# Patient Record
Sex: Male | Born: 1945
Health system: Southern US, Community
[De-identification: ages and names within clinical notes are randomized; demographics above are authoritative.]

## PROBLEM LIST (undated history)

## (undated) DIAGNOSIS — M545 Low back pain, unspecified: Secondary | ICD-10-CM

## (undated) DIAGNOSIS — M25519 Pain in unspecified shoulder: Secondary | ICD-10-CM

## (undated) DIAGNOSIS — M2550 Pain in unspecified joint: Secondary | ICD-10-CM

## (undated) DIAGNOSIS — Z87442 Personal history of urinary calculi: Secondary | ICD-10-CM

## (undated) DIAGNOSIS — E782 Mixed hyperlipidemia: Secondary | ICD-10-CM

## (undated) DIAGNOSIS — K635 Polyp of colon: Secondary | ICD-10-CM

## (undated) DIAGNOSIS — I2510 Atherosclerotic heart disease of native coronary artery without angina pectoris: Secondary | ICD-10-CM

## (undated) DIAGNOSIS — E119 Type 2 diabetes mellitus without complications: Secondary | ICD-10-CM

## (undated) DIAGNOSIS — K219 Gastro-esophageal reflux disease without esophagitis: Secondary | ICD-10-CM

## (undated) DIAGNOSIS — Z87891 Personal history of nicotine dependence: Secondary | ICD-10-CM

## (undated) HISTORY — DX: Mixed hyperlipidemia: E78.2

## (undated) HISTORY — DX: Low back pain: M54.5

## (undated) HISTORY — DX: Personal history of nicotine dependence: Z87.891

## (undated) HISTORY — DX: Pain in unspecified shoulder: M25.519

## (undated) HISTORY — DX: Polyp of colon: K63.5

## (undated) HISTORY — DX: Atherosclerotic heart disease of native coronary artery without angina pectoris: I25.10

## (undated) HISTORY — DX: Low back pain, unspecified: M54.50

## (undated) HISTORY — DX: Type 2 diabetes mellitus without complications: E11.9

## (undated) HISTORY — PX: KIDNEY STONE SURGERY: SHX686

## (undated) HISTORY — DX: Pain in unspecified joint: M25.50

## (undated) HISTORY — DX: Gastro-esophageal reflux disease without esophagitis: K21.9

## (undated) HISTORY — PX: BACK SURGERY: SHX140

---

## 1999-07-02 ENCOUNTER — Encounter: Payer: Self-pay | Admitting: Internal Medicine

## 1999-07-02 ENCOUNTER — Encounter: Admission: RE | Admit: 1999-07-02 | Discharge: 1999-07-02 | Payer: Self-pay | Admitting: Internal Medicine

## 2001-02-04 ENCOUNTER — Encounter: Admission: RE | Admit: 2001-02-04 | Discharge: 2001-02-04 | Payer: Self-pay | Admitting: Urology

## 2001-02-04 ENCOUNTER — Encounter: Payer: Self-pay | Admitting: Urology

## 2001-02-08 ENCOUNTER — Encounter: Payer: Self-pay | Admitting: Urology

## 2001-02-14 ENCOUNTER — Encounter: Payer: Self-pay | Admitting: Urology

## 2001-02-14 ENCOUNTER — Ambulatory Visit (HOSPITAL_COMMUNITY): Admission: RE | Admit: 2001-02-14 | Discharge: 2001-02-14 | Payer: Self-pay | Admitting: Urology

## 2001-02-18 ENCOUNTER — Emergency Department (HOSPITAL_COMMUNITY): Admission: EM | Admit: 2001-02-18 | Discharge: 2001-02-18 | Payer: Self-pay

## 2001-02-18 ENCOUNTER — Ambulatory Visit (HOSPITAL_COMMUNITY): Admission: RE | Admit: 2001-02-18 | Discharge: 2001-02-18 | Payer: Self-pay | Admitting: Urology

## 2001-02-25 ENCOUNTER — Encounter: Admission: RE | Admit: 2001-02-25 | Discharge: 2001-02-25 | Payer: Self-pay | Admitting: Urology

## 2001-02-25 ENCOUNTER — Encounter: Payer: Self-pay | Admitting: Urology

## 2001-03-16 ENCOUNTER — Encounter: Payer: Self-pay | Admitting: Urology

## 2001-03-16 ENCOUNTER — Encounter: Admission: RE | Admit: 2001-03-16 | Discharge: 2001-03-16 | Payer: Self-pay | Admitting: Urology

## 2010-08-10 HISTORY — PX: CORONARY ARTERY BYPASS GRAFT: SHX141

## 2010-08-10 HISTORY — PX: CARDIAC CATHETERIZATION: SHX172

## 2010-10-09 ENCOUNTER — Inpatient Hospital Stay (HOSPITAL_BASED_OUTPATIENT_CLINIC_OR_DEPARTMENT_OTHER)
Admission: RE | Admit: 2010-10-09 | Discharge: 2010-10-09 | Disposition: A | Discharge status: Other Acute Inpatient Hospital | Payer: Medicare Other | Source: Ambulatory Visit | Attending: Interventional Cardiology | Admitting: Interventional Cardiology

## 2010-10-09 ENCOUNTER — Inpatient Hospital Stay (HOSPITAL_COMMUNITY)
Admission: AD | Admit: 2010-10-09 | Discharge: 2010-10-17 | DRG: 234 | Disposition: A | Discharge status: Home / Self Care | Payer: Medicare Other | Source: Ambulatory Visit | Attending: Surgery | Admitting: Surgery

## 2010-10-09 DIAGNOSIS — E119 Type 2 diabetes mellitus without complications: Secondary | ICD-10-CM | POA: Diagnosis present

## 2010-10-09 DIAGNOSIS — E785 Hyperlipidemia, unspecified: Secondary | ICD-10-CM | POA: Diagnosis present

## 2010-10-09 DIAGNOSIS — I2510 Atherosclerotic heart disease of native coronary artery without angina pectoris: Secondary | ICD-10-CM | POA: Insufficient documentation

## 2010-10-09 DIAGNOSIS — I2584 Coronary atherosclerosis due to calcified coronary lesion: Secondary | ICD-10-CM | POA: Insufficient documentation

## 2010-10-09 DIAGNOSIS — E8779 Other fluid overload: Secondary | ICD-10-CM | POA: Diagnosis not present

## 2010-10-09 DIAGNOSIS — Z87891 Personal history of nicotine dependence: Secondary | ICD-10-CM

## 2010-10-09 DIAGNOSIS — D62 Acute posthemorrhagic anemia: Secondary | ICD-10-CM | POA: Diagnosis not present

## 2010-10-09 DIAGNOSIS — I209 Angina pectoris, unspecified: Secondary | ICD-10-CM | POA: Insufficient documentation

## 2010-10-09 LAB — GLUCOSE, CAPILLARY: Glucose-Capillary: 154 mg/dL — ABNORMAL HIGH (ref 70–99)

## 2010-10-10 DIAGNOSIS — Z01810 Encounter for preprocedural cardiovascular examination: Secondary | ICD-10-CM

## 2010-10-10 DIAGNOSIS — I2510 Atherosclerotic heart disease of native coronary artery without angina pectoris: Secondary | ICD-10-CM

## 2010-10-10 LAB — URINALYSIS, ROUTINE W REFLEX MICROSCOPIC
Glucose, UA: NEGATIVE mg/dL
Hgb urine dipstick: NEGATIVE
Protein, ur: NEGATIVE mg/dL
Specific Gravity, Urine: 1.016 (ref 1.005–1.030)
pH: 5.5 (ref 5.0–8.0)

## 2010-10-10 LAB — GLUCOSE, CAPILLARY: Glucose-Capillary: 135 mg/dL — ABNORMAL HIGH (ref 70–99)

## 2010-10-10 LAB — BASIC METABOLIC PANEL
BUN: 15 mg/dL (ref 6–23)
CO2: 26 mEq/L (ref 19–32)
Calcium: 9.2 mg/dL (ref 8.4–10.5)
Creatinine, Ser: 0.9 mg/dL (ref 0.4–1.5)
GFR calc Af Amer: >60 mL/min (ref >60)

## 2010-10-10 LAB — CBC
Hemoglobin: 14.3 g/dL (ref 13.0–17.0)
MCH: 30 pg (ref 26.0–34.0)
MCHC: 34 g/dL (ref 30.0–36.0)

## 2010-10-10 LAB — POCT I-STAT GLUCOSE: Glucose, Bld: 111 mg/dL — ABNORMAL HIGH (ref 70–99)

## 2010-10-11 LAB — GLUCOSE, CAPILLARY
Glucose-Capillary: 147 mg/dL — ABNORMAL HIGH (ref 70–99)
Glucose-Capillary: 142 mg/dL — ABNORMAL HIGH (ref 70–99)
Glucose-Capillary: 137 mg/dL — ABNORMAL HIGH (ref 70–99)

## 2010-10-12 ENCOUNTER — Inpatient Hospital Stay (HOSPITAL_COMMUNITY): Payer: Medicare Other

## 2010-10-12 LAB — GLUCOSE, CAPILLARY
Glucose-Capillary: 177 mg/dL — ABNORMAL HIGH (ref 70–99)
Glucose-Capillary: 224 mg/dL — ABNORMAL HIGH (ref 70–99)
Glucose-Capillary: 172 mg/dL — ABNORMAL HIGH (ref 70–99)
Glucose-Capillary: 178 mg/dL — ABNORMAL HIGH (ref 70–99)

## 2010-10-12 LAB — COMPREHENSIVE METABOLIC PANEL
AST: 20 U/L (ref 0–37)
Albumin: 3.7 g/dL (ref 3.5–5.2)
BUN: 16 mg/dL (ref 6–23)
Chloride: 107 mEq/L (ref 96–112)
Creatinine, Ser: 0.9 mg/dL (ref 0.4–1.5)
GFR calc Af Amer: >60 mL/min (ref >60)
Total Bilirubin: 0.7 mg/dL (ref 0.3–1.2)
Total Protein: 6.1 g/dL (ref 6.0–8.3)

## 2010-10-12 LAB — POCT I-STAT 3, ART BLOOD GAS (G3+)
Acid-Base Excess: 1 mmol/L (ref 0.0–2.0)
Acid-Base Excess: 2 mmol/L (ref 0.0–2.0)
Bicarbonate: 25.9 mEq/L — ABNORMAL HIGH (ref 20.0–24.0)
O2 Saturation: 89 %
O2 Saturation: 100 %
Patient temperature: 98.7
TCO2: 27 mmol/L (ref 0–100)
TCO2: 27 mmol/L (ref 0–100)
pCO2 arterial: 35.6 mmHg (ref 35.0–45.0)
pO2, Arterial: 201 mmHg — ABNORMAL HIGH (ref 80.0–100.0)

## 2010-10-12 LAB — CBC
MCH: 30.1 pg (ref 26.0–34.0)
MCHC: 34.3 g/dL (ref 30.0–36.0)
MCV: 87.7 fL (ref 78.0–100.0)
Platelets: 188 10*3/uL (ref 150–400)

## 2010-10-12 LAB — LIPID PANEL
HDL: 29 mg/dL — ABNORMAL LOW (ref >39)
LDL Cholesterol: 72 mg/dL (ref 0–99)
Total CHOL/HDL Ratio: 4 RATIO
Triglycerides: 82 mg/dL (ref <150)
VLDL: 16 mg/dL (ref 0–40)

## 2010-10-12 LAB — BLOOD GAS, ARTERIAL
Acid-base deficit: 0.5 mmol/L (ref 0.0–2.0)
Bicarbonate: 23 mEq/L (ref 20.0–24.0)
TCO2: 24 mmol/L (ref 0–100)
pCO2 arterial: 33.9 mmHg — ABNORMAL LOW (ref 35.0–45.0)
pH, Arterial: 7.446 (ref 7.350–7.450)
pO2, Arterial: 201 mmHg — ABNORMAL HIGH (ref 80.0–100.0)

## 2010-10-12 LAB — HEMOGLOBIN A1C: Hgb A1c MFr Bld: 7.3 % — ABNORMAL HIGH (ref <5.7)

## 2010-10-12 LAB — ABO/RH: ABO/RH(D): A POS

## 2010-10-12 LAB — PROTIME-INR: Prothrombin Time: 12.8 seconds (ref 11.6–15.2)

## 2010-10-13 ENCOUNTER — Inpatient Hospital Stay (HOSPITAL_COMMUNITY): Payer: Medicare Other

## 2010-10-13 DIAGNOSIS — I2510 Atherosclerotic heart disease of native coronary artery without angina pectoris: Secondary | ICD-10-CM

## 2010-10-13 LAB — GLUCOSE, CAPILLARY
Glucose-Capillary: 147 mg/dL — ABNORMAL HIGH (ref 70–99)
Glucose-Capillary: 115 mg/dL — ABNORMAL HIGH (ref 70–99)
Glucose-Capillary: 173 mg/dL — ABNORMAL HIGH (ref 70–99)
Glucose-Capillary: 178 mg/dL — ABNORMAL HIGH (ref 70–99)

## 2010-10-13 LAB — SURGICAL PCR SCREEN
MRSA, PCR: NEGATIVE
Staphylococcus aureus: NEGATIVE

## 2010-10-13 LAB — MAGNESIUM: Magnesium: 2.5 mg/dL (ref 1.5–2.5)

## 2010-10-13 LAB — CBC
HCT: 33 % — ABNORMAL LOW (ref 39.0–52.0)
Hemoglobin: 12.2 g/dL — ABNORMAL LOW (ref 13.0–17.0)
MCH: 30 pg (ref 26.0–34.0)
MCH: 29.6 pg (ref 26.0–34.0)
MCHC: 34.5 g/dL (ref 30.0–36.0)
MCV: 86.8 fL (ref 78.0–100.0)
RBC: 4.12 MIL/uL — ABNORMAL LOW (ref 4.22–5.81)
RDW: 12.8 % (ref 11.5–15.5)
WBC: 9.3 10*3/uL (ref 4.0–10.5)

## 2010-10-13 LAB — POCT I-STAT 3, ART BLOOD GAS (G3+)
Acid-base deficit: 1 mmol/L (ref 0.0–2.0)
Bicarbonate: 20.8 mEq/L (ref 20.0–24.0)
TCO2: 22 mmol/L (ref 0–100)
pO2, Arterial: 146 mmHg — ABNORMAL HIGH (ref 80.0–100.0)

## 2010-10-13 LAB — CREATININE, SERUM
Creatinine, Ser: 0.79 mg/dL (ref 0.4–1.5)
GFR calc Af Amer: >60 mL/min (ref >60)

## 2010-10-13 LAB — POCT I-STAT, CHEM 8
Creatinine, Ser: 0.9 mg/dL (ref 0.4–1.5)
Glucose, Bld: 176 mg/dL — ABNORMAL HIGH (ref 70–99)
Hemoglobin: 12.2 g/dL — ABNORMAL LOW (ref 13.0–17.0)
Potassium: 4.9 mEq/L (ref 3.5–5.1)

## 2010-10-13 LAB — POCT I-STAT 4, (NA,K, GLUC, HGB,HCT)
Potassium: 3.2 mEq/L — ABNORMAL LOW (ref 3.5–5.1)
Sodium: 144 mEq/L (ref 135–145)

## 2010-10-13 LAB — APTT: aPTT: 32 seconds (ref 24–37)

## 2010-10-14 ENCOUNTER — Inpatient Hospital Stay (HOSPITAL_COMMUNITY): Payer: Medicare Other

## 2010-10-14 LAB — CREATININE, SERUM: GFR calc non Af Amer: >60 mL/min (ref >60)

## 2010-10-14 LAB — CBC
HCT: 33.7 % — ABNORMAL LOW (ref 39.0–52.0)
HCT: 32.4 % — ABNORMAL LOW (ref 39.0–52.0)
MCH: 29.6 pg (ref 26.0–34.0)
MCHC: 33.5 g/dL (ref 30.0–36.0)
MCHC: 33.6 g/dL (ref 30.0–36.0)
MCV: 88.2 fL (ref 78.0–100.0)
MCV: 88 fL (ref 78.0–100.0)
Platelets: 150 10*3/uL (ref 150–400)
RDW: 13.2 % (ref 11.5–15.5)
RDW: 13.2 % (ref 11.5–15.5)
WBC: 12.8 10*3/uL — ABNORMAL HIGH (ref 4.0–10.5)

## 2010-10-14 LAB — POCT I-STAT 3, ART BLOOD GAS (G3+)
Acid-base deficit: 2 mmol/L (ref 0.0–2.0)
Bicarbonate: 27.1 mEq/L — ABNORMAL HIGH (ref 20.0–24.0)
Bicarbonate: 21.6 mEq/L (ref 20.0–24.0)
O2 Saturation: 93 %
TCO2: 28 mmol/L (ref 0–100)
pCO2 arterial: 29.5 mmHg — ABNORMAL LOW (ref 35.0–45.0)
pH, Arterial: 7.381 (ref 7.350–7.450)
pO2, Arterial: 327 mmHg — ABNORMAL HIGH (ref 80.0–100.0)
pO2, Arterial: 57 mmHg — ABNORMAL LOW (ref 80.0–100.0)

## 2010-10-14 LAB — POCT I-STAT 4, (NA,K, GLUC, HGB,HCT)
Glucose, Bld: 142 mg/dL — ABNORMAL HIGH (ref 70–99)
Glucose, Bld: 109 mg/dL — ABNORMAL HIGH (ref 70–99)
Glucose, Bld: 146 mg/dL — ABNORMAL HIGH (ref 70–99)
HCT: 37 % — ABNORMAL LOW (ref 39.0–52.0)
HCT: 26 % — ABNORMAL LOW (ref 39.0–52.0)
Hemoglobin: 12.2 g/dL — ABNORMAL LOW (ref 13.0–17.0)
Hemoglobin: 8.8 g/dL — ABNORMAL LOW (ref 13.0–17.0)
Potassium: 4 mEq/L (ref 3.5–5.1)
Potassium: 4 mEq/L (ref 3.5–5.1)
Potassium: 3.7 mEq/L (ref 3.5–5.1)
Sodium: 141 mEq/L (ref 135–145)

## 2010-10-14 LAB — BASIC METABOLIC PANEL
CO2: 21 mEq/L (ref 19–32)
Calcium: 8 mg/dL — ABNORMAL LOW (ref 8.4–10.5)
Chloride: 111 mEq/L (ref 96–112)
Creatinine, Ser: 0.77 mg/dL (ref 0.4–1.5)
Glucose, Bld: 204 mg/dL — ABNORMAL HIGH (ref 70–99)

## 2010-10-14 LAB — GLUCOSE, CAPILLARY
Glucose-Capillary: 160 mg/dL — ABNORMAL HIGH (ref 70–99)
Glucose-Capillary: 184 mg/dL — ABNORMAL HIGH (ref 70–99)

## 2010-10-14 LAB — PREPARE PLATELETS: Unit division: 0

## 2010-10-15 ENCOUNTER — Inpatient Hospital Stay (HOSPITAL_COMMUNITY): Payer: Medicare Other

## 2010-10-15 LAB — GLUCOSE, CAPILLARY
Glucose-Capillary: 124 mg/dL — ABNORMAL HIGH (ref 70–99)
Glucose-Capillary: 145 mg/dL — ABNORMAL HIGH (ref 70–99)
Glucose-Capillary: 188 mg/dL — ABNORMAL HIGH (ref 70–99)

## 2010-10-15 LAB — BASIC METABOLIC PANEL
BUN: 13 mg/dL (ref 6–23)
Creatinine, Ser: 0.82 mg/dL (ref 0.4–1.5)
GFR calc Af Amer: >60 mL/min (ref >60)
GFR calc non Af Amer: >60 mL/min (ref >60)
Potassium: 3.8 mEq/L (ref 3.5–5.1)

## 2010-10-15 LAB — CBC
MCV: 89.1 fL (ref 78.0–100.0)
Platelets: 138 10*3/uL — ABNORMAL LOW (ref 150–400)
RDW: 13.2 % (ref 11.5–15.5)
WBC: 10.8 10*3/uL — ABNORMAL HIGH (ref 4.0–10.5)

## 2010-10-15 LAB — POCT I-STAT, CHEM 8
BUN: 10 mg/dL (ref 6–23)
Calcium, Ion: 1.2 mmol/L (ref 1.12–1.32)
Chloride: 103 mEq/L (ref 96–112)
Glucose, Bld: 157 mg/dL — ABNORMAL HIGH (ref 70–99)

## 2010-10-15 LAB — CROSSMATCH: Unit division: 0

## 2010-10-16 LAB — CBC
HCT: 32.8 % — ABNORMAL LOW (ref 39.0–52.0)
Hemoglobin: 10.9 g/dL — ABNORMAL LOW (ref 13.0–17.0)
MCH: 29.1 pg (ref 26.0–34.0)
MCHC: 33.2 g/dL (ref 30.0–36.0)
MCV: 87.5 fL (ref 78.0–100.0)
Platelets: 150 10*3/uL (ref 150–400)
RBC: 3.75 MIL/uL — ABNORMAL LOW (ref 4.22–5.81)
RDW: 13.1 % (ref 11.5–15.5)
WBC: 10.1 10*3/uL (ref 4.0–10.5)

## 2010-10-16 LAB — GLUCOSE, CAPILLARY
Glucose-Capillary: 117 mg/dL — ABNORMAL HIGH (ref 70–99)
Glucose-Capillary: 107 mg/dL — ABNORMAL HIGH (ref 70–99)
Glucose-Capillary: 150 mg/dL — ABNORMAL HIGH (ref 70–99)

## 2010-10-16 LAB — BASIC METABOLIC PANEL
BUN: 14 mg/dL (ref 6–23)
CO2: 26 mEq/L (ref 19–32)
Calcium: 8.5 mg/dL (ref 8.4–10.5)
Chloride: 104 mEq/L (ref 96–112)
Creatinine, Ser: 0.83 mg/dL (ref 0.4–1.5)
GFR calc Af Amer: >60 mL/min (ref >60)
GFR calc non Af Amer: >60 mL/min (ref >60)
Glucose, Bld: 164 mg/dL — ABNORMAL HIGH (ref 70–99)
Potassium: 3.7 mEq/L (ref 3.5–5.1)
Sodium: 137 mEq/L (ref 135–145)

## 2010-10-16 NOTE — Consult Note (Signed)
Derrick Edwards, Derrick Edwards                 ACCOUNT NO.:  [id number]  MEDICAL RECORD NO.:  [id number]           PATIENT TYPE:  LOCATION:                                 FACILITY:  PHYSICIAN:  Evelene Croon, M.D.     DATE OF BIRTH:  11-02-1945  DATE OF CONSULTATION:  10/09/2010 DATE OF DISCHARGE:                                CONSULTATION   REFERRING PHYSICIAN:  Corky Crafts, MD  REASON FOR CONSULTATION:  Left main and severe multivessel coronary artery disease.  CLINICAL HISTORY:  I was asked by Dr. Eldridge Dace to evaluate Derrick Edwards for consideration of coronary artery bypass graft surgery for the above problem.  He is a 65 year old gentleman with no prior cardiac history, who reports having exertional substernal chest pain as well as shortness of breath and fatigue beginning around Christmas time.  This has been a dull pressure in the chest, has occurred typically with walking up hills or lifting heavy things.  It has always been relieved with stopping. These symptoms did not occur every day.  He had no symptoms at rest.  He ignored the symptoms until he recently went in for a routine physical examination by his primary care provider, Dr. Kevan Ny.  He said that he related the symptoms to Dr. Kevan Ny and was referred immediately for Cardiology evaluation.  A stress nuclear exam showed evidence of inferolateral ischemia.  He underwent cardiac catheterization today in [facility], which showed left main to be heavily calcified with about 60% mid left main stenosis with calcium extending into the ostium of the left circumflex.  Left circumflex was occluded at the ostium with the distal vessel filling by collaterals from the left and right.  The LAD had some ostial calcified plaque up to about 50% stenosis.  The first diagonal had about 50% ostial stenosis and the second diagonal was widely patent.  The right coronary artery was a large dominant vessel with 40% mid vessel lesion that was not  significant.  Ejection fraction was 55%.  End-diastolic pressure was 13.  There was no gradient across the aortic valve and no mitral regurgitation.  Abdominal aortogram showed no evidence of an aneurysm and no renal artery stenosis.  REVIEW OF SYSTEMS:  As follows:  GENERAL:  He denies any fever or chills.  He has had no recent weight changes.  He does report significant fatigue since around Christmas time.  EYES:  Negative.  ENT: Negative.  ENDOCRINE:  He denies hypothyroidism.  He has had diabetes for several years.  CARDIOVASCULAR:  As above.  He denies PND or orthopnea.  He has had no palpitations or peripheral edema. RESPIRATORY:  He denies cough and sputum production.  GI:  He has had no nausea, vomiting.  He denies melena and bright red blood per rectum. Does have gastroesophageal reflux disease.  GU:  He denies dysuria and hematuria.  MUSCULOSKELETAL:  He does have degenerative disk disease in his lower back causing pain.  He has had prior back surgery. NEUROLOGICAL:  He denies any focal weakness or numbness.  He denies dizziness and syncope.  He has never  had TIA or stroke.  ALLERGIES: None.  He has had myalgias and arthralgias with Vytorin.  PSYCHIATRIC: Negative.  HEMATOLOGIC:  Negative.  Medications are as noted on his medicine reconciliation form and were reviewed.  PAST MEDICAL HISTORY:  Significant for diabetes and hyperlipidemia.  He has a history of lower back surgery in the past.  Had history of meningitis as a child.  He underwent left knee arthroscopy in February 2008.  FAMILY HISTORY:  He had 12 siblings and 3 or 4 with heart disease.  His father died of heart disease and had coronary artery bypass surgery performed.  SOCIAL HISTORY:  He is retired.  He lives with his wife.  He is a previous smoker.  Denies alcohol abuse.  PHYSICAL EXAMINATION:  VITAL SIGNS:  His blood pressure is 125/70, pulse is 63 and regular, respiratory rate is 16 and  unlabored. GENERAL:  He is a well-developed white male in no distress. HEENT:  Normocephalic and atraumatic.  Pupils are equal and reactive to light.  Extraocular muscles are intact.  Oropharynx is clear. NECK:  Normal carotid pulses bilaterally.  There are no bruits.  There is no adenopathy or thyromegaly. CARDIAC:  Regular rate and rhythm with normal S1-S2.  There is no murmur, rub or gallop. LUNGS:  Clear. ABDOMEN:  Active bowel sounds.  The abdomen is soft and nontender. There are no palpable masses or organomegaly. EXTREMITIES:  No peripheral edema.  Pedal pulses are palpable bilaterally. SKIN:  Warm and dry. NEUROLOGIC:  Alert and oriented x3.  Motor and sensory exams are grossly normal.  IMPRESSION:  Derrick Edwards has left main and three-vessel coronary artery disease with an occluded left circumflex.  I agree that coronary artery bypass graft surgery is the best treatment to prevent further ischemia and infarction and sudden death.  I discussed the operative procedure with him and his wife including alternatives, benefits, and risks including, but not limited to bleeding, blood transfusion, infection, stroke, myocardial infarction, graft failure, and death.  He understands all this and agrees to proceed.  We will plan to surgery on Monday October 13, 2010.     Evelene Croon, M.D.     BB/MEDQ  D:  10/09/2010  T:  10/10/2010  Job:  [id number]  cc:   Corky Crafts, MD Candyce Churn, M.D.  Electronically Signed by Evelene Croon M.D. on 10/15/2010 11:32:10 AM

## 2010-10-16 NOTE — Op Note (Signed)
NAME:  Derrick Edwards, Derrick Edwards                 ACCOUNT NO.:  [id number]  MEDICAL RECORD NO.:  [id number]           PATIENT TYPE:  I  LOCATION:  2310                         FACILITY:  [facility]  PHYSICIAN:  Evelene Croon, M.D.     DATE OF BIRTH:  23-Apr-1946  DATE OF PROCEDURE:  10/13/2010 DATE OF DISCHARGE:                              OPERATIVE REPORT   PREOPERATIVE DIAGNOSIS:  Left main and severe multivessel coronary artery disease.  POSTOPERATIVE DIAGNOSIS:  Left main and severe multivessel coronary artery disease.  OPERATIVE PROCEDURE:  Median sternotomy, extracorporeal circulation, coronary artery bypass graft surgery x3 using a left internal mammary artery graft to left anterior descending coronary artery, with a saphenous vein graft to the first diagonal branch of the LAD, and a saphenous vein graft to the obtuse marginal branch of the left circumflex coronary artery.  Endoscopic vein harvesting from the right leg.  ATTENDING SURGEON:  Evelene Croon, MD  ASSISTANT:  Doree Fudge, PA-C.  ANESTHESIA:  General endotracheal.  CLINICAL HISTORY:  This patient is a 65 year old gentleman with no prior cardiac history, who reports having exertional substernal chest pain as well as shortness of breath and fatigue, beginning around Christmas time.  He recently seen for a physical examination and related his symptoms to his primary physician who referred him for cardiology evaluation.  A stress nuclear exam showed inferolateral ischemia. Cardiac catheterization showed left main to be heavily calcified with about 60% mid left main stenosis with calcium extending into the ostium of left circumflex.  The left circumflex was occluded at the ostium with the distal vessel filling by collaterals from the left and right.  The LAD had some ostial calcified plaque up to about 50%.  There was a moderate first diagonal branch that had about 60% ostial stenosis.  The second diagonal was widely  patent.  The right coronary artery was a large dominant vessel with about 40% mid vessel lesion that was not significant.  Ejection fraction is 65%.  There was no gradient across the aortic valve and no mitral regurgitation.  After review of the catheterization examination, the patient was felt that coronary bypass graft surgery is the best treatment to prevent further ischemia infarction.  I discussed the operative procedure with the patient and his wife.  I discussed alternatives, benefits, and risks including but not limited to bleeding, blood transfusion, infection, stroke, myocardial infarction, graft failure, and death.  He understood all this and agreed to proceed.  OPERATIVE PROCEDURE:  The patient was taken to the operating room, placed on table in supine position.  After induction of general endotracheal anesthesia, a Foley catheter was placed in bladder using sterile technique.  Then, the chest, abdomen, and both lower extremities were prepped and draped in usual sterile manner.  The chest was entered through a median sternotomy incision.  The pericardium opened in midline.  Examination of the heart showed good ventricular contractility.  The ascending aorta had no palpable plaques in it and was relatively short.  Then, the left internal mammary artery was harvested from the chest wall as pedicle graft.  This was a medium  caliber vessel with excellent blood flow through it.  Same time, a segment of greater saphenous vein was harvested from the right leg using endoscopic vein harvest technique. This vein was a medium size and good quality.  Then, the patient was heparinized when an adequate ACT was obtained. The distal ascending aorta was cannulated using a 20-French aortic cannula for arterial inflow.  Venous outflow was achieved using a two- stage venous cannula through the right atrial appendage.  An antegrade cardioplegia and vent cannula was inserted in the aortic  root.  The patient was placed on cardiopulmonary bypass and distal coronaries identified.  The LAD was a large graftable vessel.  The first diagonal branch was a moderate size graftable vessel.  The obtuse marginal was a moderate size vessel with mild distal disease in it.  Then, the aorta was crossclamped and 600 mL of cold blood antegrade cardioplegia was administered in the aortic root with quick arrest of the heart.  Systemic hypothermia to 32 degrees centigrade and topical hypothermic with iced saline was used.  A temperature probe was placed in the septum and an insulating pad in the pericardium.  The first distal anastomosis was performed to the obtuse marginal branch.  The internal diameter of this vessel was 1.75 mm.  Conduit used was a segment of greater saphenous vein and the anastomosis performed in end-to-side manner using continuous 7-0 Prolene suture.  The flow was noted through the graft and was excellent.  The second distal anastomosis was performed to the first diagonal branch.  The internal diameter of this vessel was 1.6 mm.  The conduit used was a second segment of greater saphenous vein and the anastomosis performed in end-to-side manner using continuous 7-0 Prolene suture. The flow was noted through the graft and was excellent.  The third distal anastomosis was performed in the mid LAD.  The internal diameter of this vessel was 1.75 mm.  The conduit used was a left internal mammary graft was brought through an opening in the left pericardium anterior to the phrenic nerve.  It was anastomosed to the LAD in an end-to-side manner using continuous 8-0 Prolene suture.  The pedicle was sutured to the epicardium with 6-0 Prolene sutures.  The patient then given another dose of cardioplegia.  With a crossclamp in place, the two proximal vein graft anastomoses were performed to the mid ascending aorta in an end-to-side manner using continuous 6-0 Prolene suture.  Then,  the clamp was removed from mammary pedicle.  There was rapid warming of the ventricular septum and return of spontaneous ventricular fibrillation.  The cross-clamp was removed with a time of 61 minutes and the patient spontaneously converted to sinus rhythm.  The proximal and distal anastomoses appeared hemostatic and allowed the grafts satisfactory.  Graft markers were placed around the proximal anastomoses.  Two temporary right ventricular and right atrial pacing wires placed and brought out through the skin.  When the patient had rewarmed to 37-degree centigrade, he was weaned from cardiopulmonary bypass on no inotropic agents.  Total bypass time was 75 minutes.  Cardiac function appeared excellent with a cardiac output of 6 liters per minute.  Protamine was given, and the venous and aortic cannulas were removed without difficulty.  Hemostasis was achieved.  Three chest tubes were placed with a tube in the posterior pericardium, one in left pleural space, and one in anterior mediastinum. The sternum was then closed with a double #6 stainless steel wires.  The fascia was closed with continuous #  1 Vicryl suture.  Subcutaneous tissue was closed with continuous 2-0 Vicryl and the skin with 3-0 Vicryl subcuticular closure.  The lower extremity vein harvest site was closed in layers in similar manner.  The sponge, needle, and instrument counts were correct according to the scrub nurse.  Dry sterile dressing was applied over the incisions around the chest tubes with Pleur-Evac suction.  The patient remained hemodynamically stable and was transported to the SICU in guarded, but stable condition.     Evelene Croon, M.D.     BB/MEDQ  D:  10/13/2010  T:  10/14/2010  Job:  [id number]  cc:   Corky Crafts, MD  Electronically Signed by Evelene Croon M.D. on 10/15/2010 11:32:13 AM

## 2010-10-17 NOTE — Procedures (Signed)
NAME:  Derrick Edwards, Derrick Edwards                 ACCOUNT NO.:  [id number]  MEDICAL RECORD NO.:  [id number]           PATIENT TYPE:  I  LOCATION:  2920                         FACILITY:  [facility]  PHYSICIAN:  Corky Crafts, MDDATE OF BIRTH:  Dec 05, 1945  DATE OF PROCEDURE:  10/09/2010 DATE OF DISCHARGE:                           CARDIAC CATHETERIZATION   REFERRING PHYSICIAN:  Candyce Churn, MD  PROCEDURES PERFORMED:  Left heart catheterization, left ventriculogram, coronary angiogram, abdominal aortogram, left subclavian angiogram.  OPERATOR:  Corky Crafts, MD  INDICATIONS:  Angina, abnormal stress test.  PROCEDURE NARRATIVE:  The risks and benefits of cardiac catheterization were explained to the patient and informed consent was obtained.  He was brought to the Cath Lab.  He was prepped and draped in usual sterile fashion.  His right groin was infiltrated with 1% lidocaine.  A 4-French sheath was placed into the right common femoral artery using the modified Seldinger technique.  Left coronary artery angiography was performed using JL-4 pigtail catheter.  The catheter was advanced to the vessel ostium under fluoroscopic guidance.  Digital angiography was performed in multiple projections using hand injection of contrast. Right coronary artery angiography was then performed using a JR-4 pigtail catheter in a similar fashion.  A pigtail catheter was advanced to the ascending aorta and across the aortic valve under fluoroscopic guidance.  A power injection of contrast was performed in the RAO projection to image the left ventricle.  The catheter was then pulled back under continuous hemodynamic pressure monitoring.  The catheter was then withdrawn to the abdominal aorta.  Power injection of contrast was performed in the AP projection.  The sheath was removed using manual compression.  FINDINGS:  The left main is heavily calcified in the midvessel.  There is a 60% lesion with  the calcium extending into the ostium of the left circumflex. The left circumflex is occluded at the ostium.  They are faint left-to- left collaterals.  The majority of the vessel was filled by right-to- left collaterals. The left anterior descending has ostial calcified plaque up to 50%. There is mild plaque in the proximal vessel.  The mid-to-distal vessel has only mild irregularities.  They are medium-sized diagonals.  The first diagonal has an ostial 50% stenosis.  The second diagonal appears widely patent. The right coronary artery is a large dominant vessel in the midvessel, there is a tubular 40% lesion. Left ventriculogram shows normal ventricular function.  The estimated ejection fraction is 55%.  HEMODYNAMICS:  90/11 is the left ventricular pressure.  LVEDP 30 mmHg. Aortic pressure 89/51 with a mean aortic pressure of 68 mmHg.  Abdominal aortogram shows no abdominal aortic aneurysm.  There are bilateral single renal arteries both of which appeared widely patent. The left subclavian angiogram shows mild ostial subclavian plaque, but no flow-limiting disease.  The LIMA appears patent.  IMPRESSION: 1. Severe left main disease and occluded circumflex. 2. Normal left ventricular function. 3. No abdominal aortic aneurysm or renal artery stenosis.  RECOMMENDATIONS:  We will obtain CVTS consult for possible bypass surgery.  I suspect he would need graft to his LAD  system and to the circumflex system.  He will be admitted and watched overnight.     Corky Crafts, MD     JSV/MEDQ  D:  10/09/2010  T:  10/10/2010  Job:  [id number]  Electronically Signed by Lance Muss MD on 10/16/2010 10:06:41 AM

## 2010-10-29 NOTE — Discharge Summary (Signed)
NAME:  Derrick Edwards, Derrick Edwards                 ACCOUNT NO.:  [id number]  MEDICAL RECORD NO.:  [id number]           PATIENT TYPE:  I  LOCATION:  2014                         FACILITY:  [facility]  PHYSICIAN:  Evelene Croon, M.D.     DATE OF BIRTH:  [birth year]  DATE OF ADMISSION:  10/09/2010 DATE OF DISCHARGE:                              DISCHARGE SUMMARY   HISTORY:  The patient is a 65 year old gentleman with no prior cardiac history who reports having exertional substernal chest pain as well as shortness of breath and fatigue beginning around Christmas time last year.  This has been a dull pressure in the chest and occurred typically with walking up hills or lifting heavy things.  It has always been relieved with stopping.  These symptoms would not occur every day.  He had no symptoms at rest.  He has ignored the symptoms until recently when he went for a routine physical examination by his primary care physician Dr. Kevan Ny.  He related the symptoms to Dr. Kevan Ny and was referred to Dr. Eldridge Dace for cardiology evaluation.  A stress nuclear exam showed evidence of inferolateral ischemia.  He was admitted this hospitalization for cardiac catheterization for further evaluation and treatment.  PAST MEDICAL HISTORY: 1. Diabetes mellitus. 2. Hyperlipidemia. 3. Previous history of lower back surgery. 4. Remote history of meningitis as a child. 5. History of left knee arthroscopy in February 2008.  MEDICATIONS PRIOR TO ADMISSION:  Included the following: 1. Aspirin 81 mg daily. 2. Over-the-counter cinnamon supplement. 3. Crestor 10 mg daily. 4. Coenzyme Q10 100 mg daily with meals. 5. Fish oil 1000 mg daily. 6. Metformin 500 mg twice daily. 7. Onglyza 5 mg daily. 8. Prilosec 20 mg daily. 9. Flomax 0.4 mg daily at bedtime. 10.Vitamin D3 over-the-counter supplement once daily. 11.Nitroglycerin 0.4 mg sublingual p.r.n. for chest pain.  REVIEW OF SYMPTOMS:  Please see the history and physical done  at admission.  FAMILY HISTORY:  The patient has 12 siblings with 3 or 4 with known heart disease.  His father died of heart disease and also had coronary artery bypass grafting.  SOCIAL HISTORY:  He is retired.  He lives with his wife.  He is a previous smoker.  He denies alcohol abuse.  PHYSICAL EXAMINATION:  Please see the history and physical done prior to admission.  HOSPITAL COURSE:  The patient was admitted electively for cardiac catheterization.  He was found to have a heavily calcified left main coronary artery with about 60% mid artery stenosis and calcium extending into the ostium of the left circumflex.  The left circumflex was occluded at the ostium with distal filling via collaterals from the left and right.  The LAD had some ostial calcification plaque up to about 50%.  The first diagonal had a 50% ostial stenosis and the second diagonal was widely patent.  The right coronary artery was a large dominant vessel with 40% midvessel lesion that was not significant. Ejection fraction was estimated at 55%.  End-diastolic pressure was 13. There was no gradient across the aortic valve and no mitral regurgitation.  Abdominal aortogram showed no evidence  of aneurysm and no renal artery stenosis.  Due to these findings, surgical consultation was obtained with Evelene Croon, MD who evaluated the patient and studies and agreed with recommendations to proceed with surgical revascularization.  PROCEDURE:  On October 13, 2010, the patient underwent the following procedure: Coronary artery bypass grafting x3.  The following grafts were placed. 1. Left internal mammary artery to the LAD. 2. Saphenous vein graft to the first diagonal. 3. Saphenous vein graft to the obtuse marginal.  The patient tolerated     the procedure well, was taken to the surgical intensive care unit     in stable condition.  POSTOPERATIVE HOSPITAL COURSE:  The patient has done quite well.  He has maintained  stable neurologic exam.  He did not have significant postoperative bleeding.  He was extubated without difficulty.  All routine lines, monitors and drainage devices have been discontinued in the standard fashion.  Oxygen has been weaned and he maintained good saturations on room air.  He has a mild acute blood loss anemia.  His most recent hemoglobin and hematocrit dated October 16, 2010 are 10.9 and 32.8 respectively.  Electrolytes, BUN and creatinine are within normal limits.  He has required some gentle diuresis for mild postoperative volume overload.  He is currently now at his preoperative weight and showing no evidence of significant edema.  His incisions are healing well without evidence of infection.  He has had no significant cardiac dysrhythmias.  His capillary blood glucose has been under good control using standard protocols.  Currently, his status is felt to be tentatively stable for discharge in the morning of October 17, 2010, pending morning round reevaluation.  INSTRUCTIONS:  The patient will receive written instructions regarding medications, activity, diet, wound care and followup.  FOLLOWUP:  Dr. Eldridge Dace 2 weeks postdischarge.  Dr. Laneta Simmers 3 weeks postdischarge.  MEDICATIONS AT DISCHARGE:  At the time of this dictation include; 1. Aspirin enteric-coated tablet 325 mg daily. 2. Lopressor 25 mg p.o. b.i.d. 3. Oxycodone 5 mg IR tablet one to two every 4-6 hours as needed for     pain. 4. Over-the-counter cinnamon daily. 5. Crestor 10 mg daily. 6. Coenzyme Q10 100 mg daily. 7. Fish oil 1000 mg daily. 8. Metformin 500 mg twice daily. 9. Onglyza 5 mg one tablet daily. 10.Prilosec 20 mg daily. 11.Flomax 0.4 mg daily at bedtime. 12.Vitamin D3 over-the-counter supplement daily.  FINAL DIAGNOSES:  Severe coronary artery disease, status post surgical revascularization as described above.  OTHER DIAGNOSES: 1. Postoperative volume overload. 2. Postoperative acute blood loss  anemia. 3. Diabetes mellitus type 2. 4. Hyperlipidemia. 5. Previous history of lower back surgery. 6. History of meningitis as a child. 7. Previous left knee arthroscopy. 8. Previous tobacco abuse.     Rowe Clack, P.A.-C.   ______________________________ Evelene Croon, M.D.    Sherryll Burger  D:  10/16/2010  T:  10/16/2010  Job:  [id number]  cc:   Corky Crafts, MD Candyce Churn, M.D.  Electronically Signed by Gershon Crane P.A.-C. on 10/22/2010 11:03:42 AM Electronically Signed by Evelene Croon M.D. on 10/29/2010 09:56:58 AM

## 2010-11-10 ENCOUNTER — Other Ambulatory Visit: Payer: Self-pay | Admitting: Surgery

## 2010-11-10 DIAGNOSIS — I2510 Atherosclerotic heart disease of native coronary artery without angina pectoris: Secondary | ICD-10-CM

## 2010-11-11 ENCOUNTER — Encounter (INDEPENDENT_AMBULATORY_CARE_PROVIDER_SITE_OTHER): Payer: Self-pay | Admitting: Surgery

## 2010-11-11 ENCOUNTER — Ambulatory Visit
Admission: RE | Admit: 2010-11-11 | Discharge: 2010-11-11 | Disposition: A | Discharge status: Home / Self Care | Payer: Medicare Other | Source: Ambulatory Visit | Attending: Surgery | Admitting: Surgery

## 2010-11-11 DIAGNOSIS — I2510 Atherosclerotic heart disease of native coronary artery without angina pectoris: Secondary | ICD-10-CM

## 2010-11-12 NOTE — Assessment & Plan Note (Signed)
OFFICE VISIT  LABRADFORD, SCHNITKER DOB:  05/01/1946                                        November 11, 2010 CHART #:  [id number]  The patient returned to my office today for followup, status post coronary artery bypass graft surgery x3 on October 13, 2010.  He has been feeling well at home.  He is walking daily without chest pain or shortness of breath.  He did have one episode last Friday, where he developed some shortness of breath while lying in bed, but this resolved quickly.  PHYSICAL EXAMINATION:  Vital Signs:  Blood pressure is 118/75, his pulse is 58 and regular, respiratory rate is 16 and unlabored, oxygen saturation on room air is 96%.  General:  He looks well.  Cardiac: Regular rate and rhythm with normal heart sounds.  Lungs:  Clear.  Chest incision is healing well and the sternum is stable.  Extremities:  His leg incision is healing well.  There is no peripheral edema.  Followup chest x-ray today shows clear lung fields and no pleural effusions.  MEDICATIONS: 1. Aspirin 325 mg daily. 2. Lopressor 25 mg b.i.d. 3. Crestor 10 mg daily. 4. Cinnamon daily. 5. Coenzyme Q10 100 mg daily. 6. Fish oil 1000 mg daily. 7. Metformin 500 mg b.i.d. 8. Onglyza 5 mg daily. 9. Prilosec 20 mg daily. 10.Flomax 0.4 mg nightly. 11.Vitamin D3 over-the-counter daily.  IMPRESSION:  Overall, the patient is making a good recovery following his surgery.  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  He will continue to follow up with Dr. Eldridge Dace and Dr. Kevan Ny and he will contact me if he develops any problems with his incisions.  Evelene Croon, M.D. Electronically Signed  BB/MEDQ  D:  11/11/2010  T:  11/11/2010  Job:  [id number]  cc:   Corky Crafts, MD Candyce Churn, M.D.

## 2012-03-17 IMAGING — CR DG CHEST 2V
2 series · 2 of 2 positions shown · non-contrast
Comparison: None

CLINICAL DATA: Bypass surgery.

CHEST - 2 VIEW

[w chest pa]
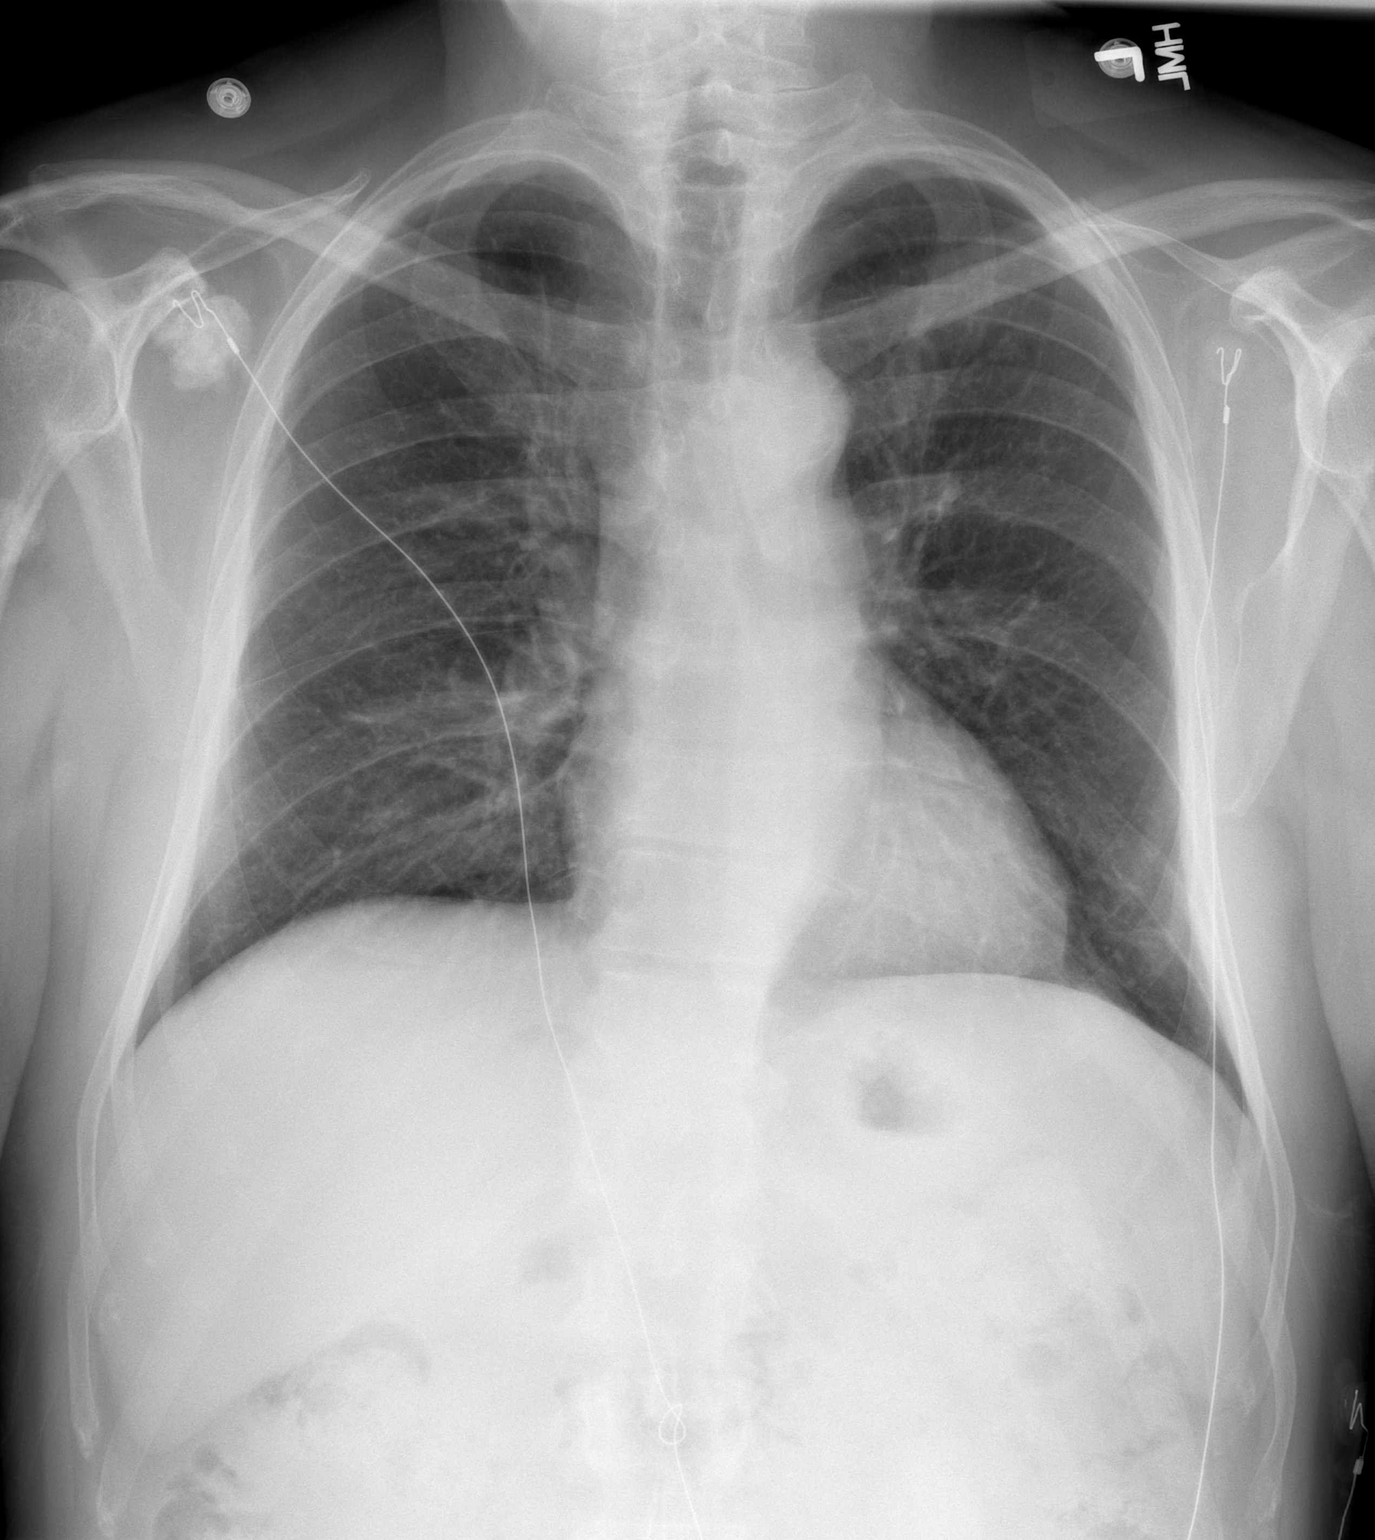

[w chest lat]
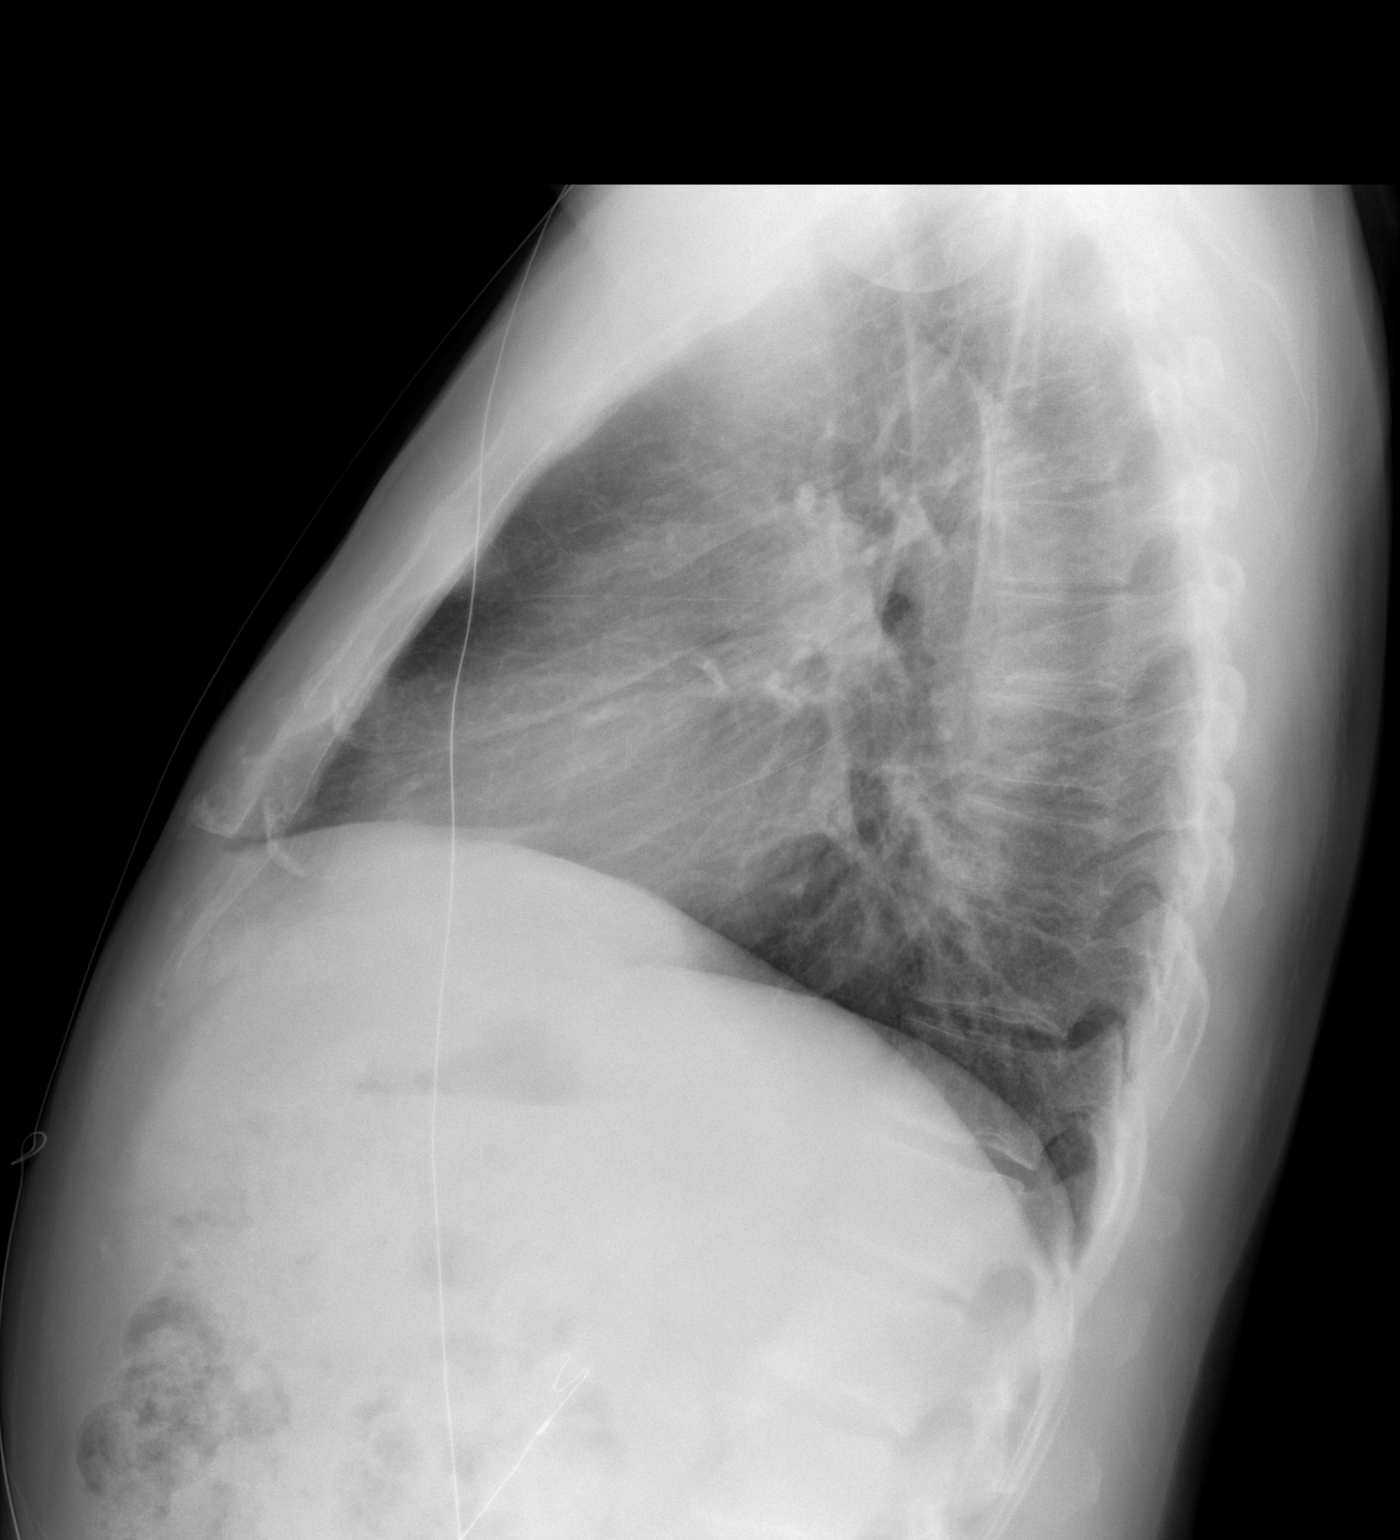

[2 of 2 positions shown; findings below may reference images not displayed]

FINDINGS: The cardiac silhouette, mediastinal and hilar contours
are within normal limits.  The lungs are clear.  The bony thorax is
intact.  Calcifications noted in the subcoracoid bursa.
IMPRESSION: No acute cardiopulmonary findings.

## 2012-03-18 IMAGING — CR DG CHEST 1V PORT
1 series · 1 of 1 positions shown · non-contrast
Comparison: 10/12/2010

CLINICAL DATA: Status post CABG.

PORTABLE CHEST - 1 VIEW

[view not recorded]
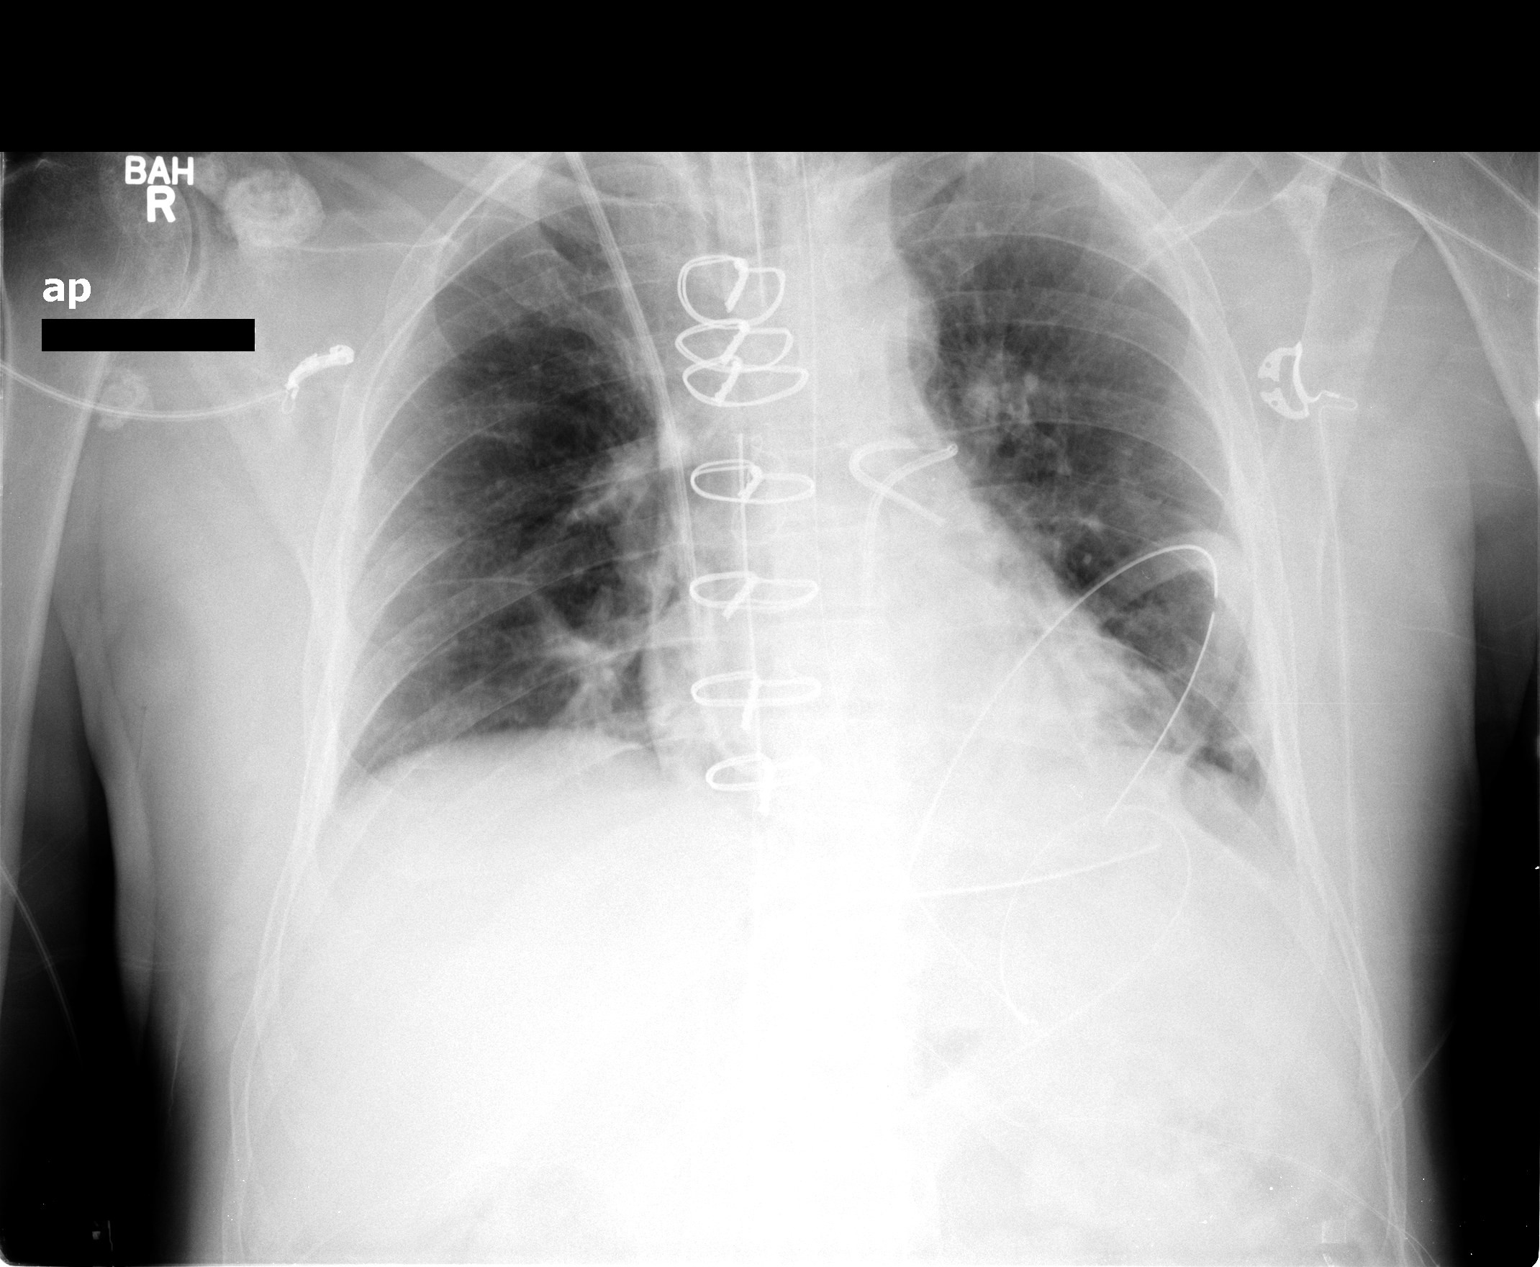

[1 of 1 positions shown; findings below may reference images not displayed]

FINDINGS: The patient has had a median sternotomy.  Endotracheal
tube is in place with tip 3.7 cm above carina.  Nasogastric tube
has been placed with tip overlying the level of the stomach.  Right
Swan-Ganz catheter tip is coiled or tortuous, tip directed towards
the left lower lobe pulmonary artery.  Mediastinal drains  are in
place.  There is a left-sided chest tube.  There is bilateral lower
lobe atelectasis.  No evidence for pneumothorax.
IMPRESSION: 1.  Postoperative appearance following median sternotomy.
2.  Bibasilar atelectasis.
3.  No evidence for pneumothorax.

## 2012-03-19 IMAGING — CR DG CHEST 1V PORT
1 series · 1 of 1 positions shown · non-contrast
Comparison: October 13, 2010

CLINICAL DATA: Postop CABG

PORTABLE CHEST - 1 VIEW

[view not recorded]
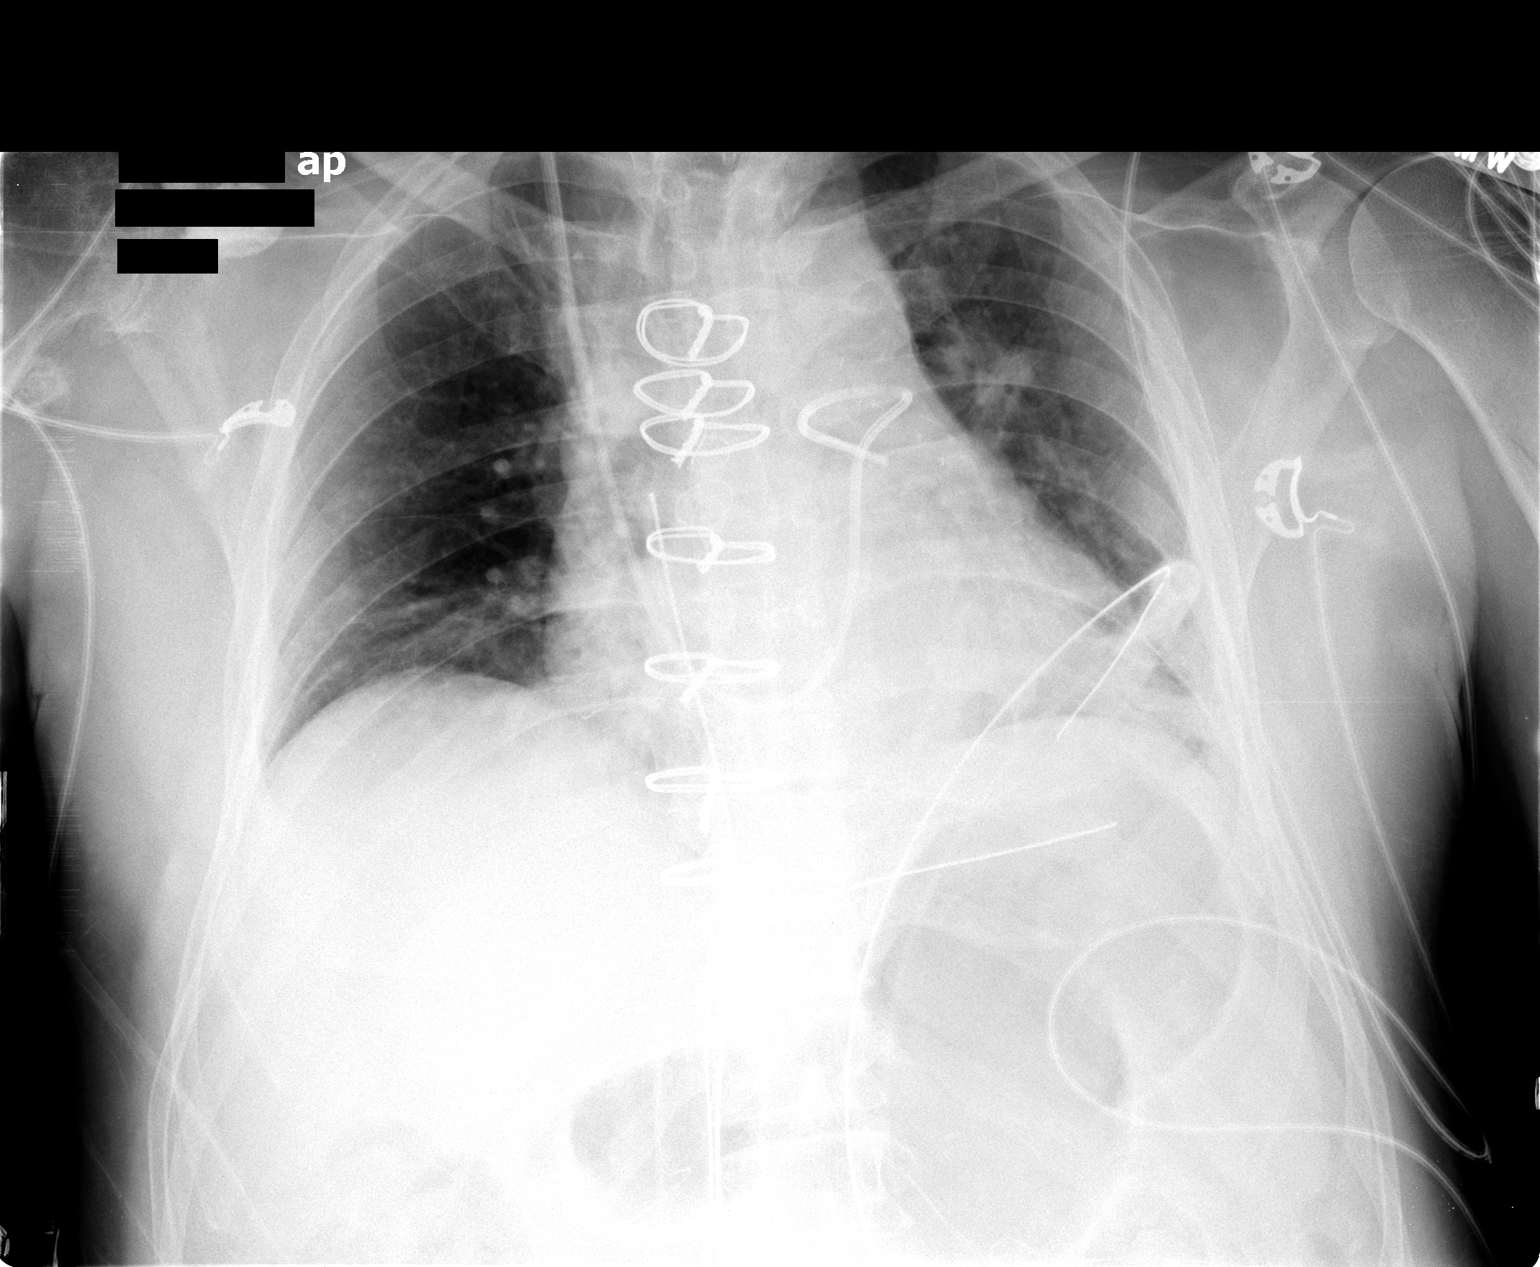

[1 of 1 positions shown; findings below may reference images not displayed]

FINDINGS: The ET tube and nasogastric tube have been removed.
Mediastinal and left pleural drains are stable in position.  The
Swan-Ganz catheter tip is unchanged, coursing to the left main
pulmonary artery.  The cardiac silhouette and mediastinum are
within normal limits.  There is mild pulmonary vascular
cephalization without frank edema.  Bibasilar atelectasis and small
left pleural effusion are unchanged.  No pneumothorax.
IMPRESSION: Mild pulmonary vascular cephalization without frank edema.

Bibasilar atelectasis and small left pleural effusion, unchanged.

## 2012-03-20 IMAGING — CR DG CHEST 1V PORT
1 series · 1 of 1 positions shown · non-contrast
Comparison: 10/14/2010

CLINICAL DATA: Post CABG

PORTABLE CHEST - 1 VIEW

[AP]
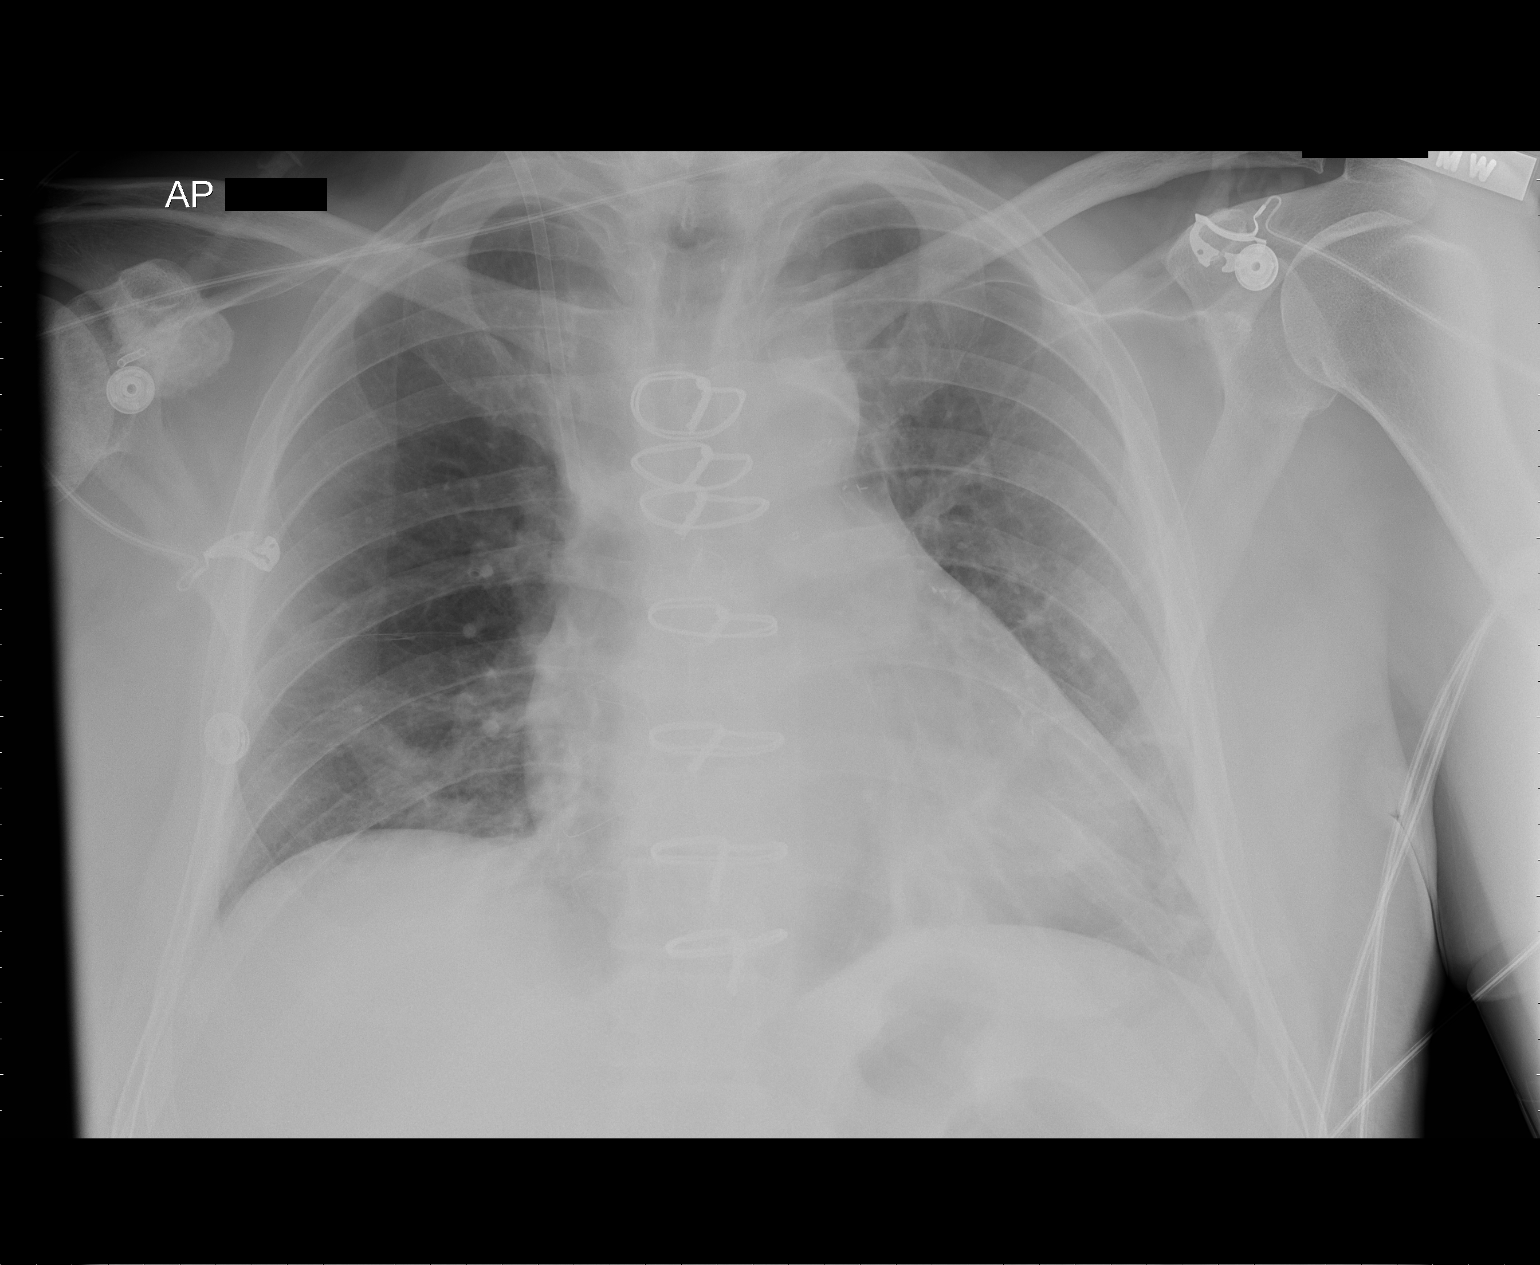

[1 of 1 positions shown; findings below may reference images not displayed]

FINDINGS: Swan-Ganz catheter removed.  Mediastinal drains and left
chest tube removed.  Mild bibasilar atelectasis.  No vascular
congestion or pneumothorax.
IMPRESSION: Satisfactory postoperative chest x-ray - multiple tubes and lines
removed.

## 2014-02-01 ENCOUNTER — Encounter: Payer: Self-pay | Admitting: Interventional Cardiology

## 2014-02-20 ENCOUNTER — Other Ambulatory Visit: Payer: Self-pay | Admitting: Orthopaedic Surgery

## 2014-02-27 ENCOUNTER — Ambulatory Visit: Payer: Medicare Other | Admitting: Interventional Cardiology

## 2014-03-13 ENCOUNTER — Other Ambulatory Visit: Payer: Self-pay | Admitting: Cardiology

## 2014-03-13 ENCOUNTER — Encounter: Payer: Self-pay | Admitting: Cardiology

## 2014-03-13 DIAGNOSIS — E782 Mixed hyperlipidemia: Secondary | ICD-10-CM | POA: Insufficient documentation

## 2014-03-13 DIAGNOSIS — I2510 Atherosclerotic heart disease of native coronary artery without angina pectoris: Secondary | ICD-10-CM | POA: Insufficient documentation

## 2014-03-16 ENCOUNTER — Ambulatory Visit (INDEPENDENT_AMBULATORY_CARE_PROVIDER_SITE_OTHER): Payer: Commercial Managed Care - HMO | Admitting: Interventional Cardiology

## 2014-03-16 ENCOUNTER — Encounter: Payer: Self-pay | Admitting: Interventional Cardiology

## 2014-03-16 VITALS — BP 111/74 | HR 70 | Ht 68.0 in | Wt 161.0 lb

## 2014-03-16 DIAGNOSIS — Z01810 Encounter for preprocedural cardiovascular examination: Secondary | ICD-10-CM

## 2014-03-16 DIAGNOSIS — E782 Mixed hyperlipidemia: Secondary | ICD-10-CM

## 2014-03-16 DIAGNOSIS — I2510 Atherosclerotic heart disease of native coronary artery without angina pectoris: Secondary | ICD-10-CM

## 2014-03-16 NOTE — Progress Notes (Signed)
Patient ID: Derrick Edwards, male   DOB: May 20, 1946, 68 y.o.   MRN: [id number]    Derrick Edwards, [place]  [address] Phone: [phone] Fax:  [phone]  Date:  03/16/2014   ID:  [name], [name] 01/02/1946, MRN [id number]  PCP:  Henrine Screws, MD      History of Present Illness: Derrick Edwards is a 68 y.o. male who had CABG in 2012. He walked 30 minutes 5 days a week. His knees are bothering him. He is having left knee replacement. CAD/ASCVD:  knee swelling. Denies : Chest pain.  Dizziness.  Dyspnea on exertion.  Leg edema.  Nitroglycerin.  Orthopnea.  Palpitations.  Syncope.   He walks up a hill at home without CP, SHOB.  He walked a mile on a trail last week without any cardiac sx.    Wt Readings from Last 3 Encounters:  03/16/14 161 lb (73.029 kg)     Past Medical History  Diagnosis Date  . Diabetes   . Coronary atherosclerosis of unspecified type of vessel, native or graft     left main CAD, 10/2010-Dr. V and Dr. Cyndia Bent  . Mixed hyperlipidemia   . GERD (gastroesophageal reflux disease)   . Shoulder pain   . Low back pain     associated with degenerative disc disease   . Arthralgia     with Vytorin and myalgias   . History of tobacco use   . Hyperplastic colon polyp     2, 2008, repeat colonoscopy in 2018    Current Outpatient Prescriptions  Medication Sig Dispense Refill  . aspirin 325 MG tablet Take 325 mg by mouth daily.      . cholecalciferol (VITAMIN D) 1000 UNITS tablet Take 1,000 Units by mouth daily.      . Coenzyme Q10 (COQ10) 100 MG CAPS Take 100 mg by mouth daily.      Marland Kitchen lisinopril (PRINIVIL,ZESTRIL) 2.5 MG tablet Take 2.5 mg by mouth daily.      . metFORMIN (GLUCOPHAGE-XR) 500 MG 24 hr tablet Take 500 mg by mouth 2 (two) times daily.      . metoprolol tartrate (LOPRESSOR) 25 MG tablet Take 25 mg by mouth 2 (two) times daily.      . nitroGLYCERIN (NITROSTAT) 0.4 MG SL tablet Place 0.4 mg under the tongue every 5 (five)  minutes as needed for chest pain.      . NON FORMULARY GLUCOSE SUPPORT to help with Diabetes  1 capsule after meals  Three times a day      . Omega-3 Fatty Acids (FISH OIL) 1000 MG CAPS Take 1,000 mg by mouth daily.      Marland Kitchen omeprazole (PRILOSEC) 20 MG capsule Take 20 mg by mouth daily. 1 capsule      . rosuvastatin (CRESTOR) 5 MG tablet Take 5 mg by mouth daily.      . saxagliptin HCl (ONGLYZA) 2.5 MG TABS tablet Take 5 mg by mouth daily.      . tamsulosin (FLOMAX) 0.4 MG CAPS capsule Take 0.4 mg by mouth daily after supper.       No current facility-administered medications for this visit.    Allergies:    Allergies  Allergen Reactions  . Ezetimibe-Simvastatin     myalgias    Social History:  The patient  reports that he has quit smoking. He does not have any smokeless tobacco history on file. He reports that he does not drink alcohol or  use illicit drugs.   Family History:  The patient's family history includes Heart disease in his father.   ROS:  Please see the history of present illness.  No nausea, vomiting.  No fevers, chills.  No focal weakness.  No dysuria. Knee pain   All other systems reviewed and negative.   PHYSICAL EXAM: VS:  BP 111/74  Pulse 70  Ht 5\' 8"  (1.727 m)  Wt 161 lb (73.029 kg)  BMI 24.49 kg/m2 Well nourished, well developed, in no acute distress HEENT: normal Neck: no JVD, no carotid bruits Cardiac:  normal S1, S2; RRR;  Lungs:  clear to auscultation bilaterally, no wheezing, rhonchi or rales Abd: soft, nontender, no hepatomegaly Ext: no edema Skin: warm and dry Neuro:   no focal abnormalities noted  EKG:  Normal ECG , no significant ST segment changes.   ASSESSMENT AND PLAN:  CAD (coronary artery disease)  Continue Aspirin Tablet Dispersible, 325 mg, as directed, Orally, Daily Continue Metoprolol Tartrate Tablet, 25 MG, 1 tablet, Orally, Twice a day Refill Nitroglycerin 0.4 mg tablet, 0.4 mg, 1 tablet as directed, SL, as directed prn chest pain,  30 days, 25, Refills 3 IMAGING: EKG    Harward,Amy 02/27/2013 10:45:23 AM > Donell Tomkins,JAY 02/27/2013 11:24:57 AM > SB, mild early repol.   Notes: Angina resolved. DOing well post bypass. No AAA in 3/12 by cath.    2. Hypercholesterolemia, Mixed  Continue Crestor Tablet, 5MG , 1 tablet, Orally, Once a day Continue Fish Oil Capsule, 1000 MG, 1 capsule with a meal, Orally, Once a day Continue CoQ10 Capsule, 100 MG, 1 capsule with a meal, Orally, Once a day Notes: Well controlled in 3/13. LDL 76. HDL low 32; 3/14 LDL 68, HDL 29.   4/15 Lipids: HDL 30, LDL 79    3.  Preoperative eval: No further cardiac testing needed prior to surgery.  OK to stop aspirin a week before surgery.  He is at low to intermediate risk for cardiac complication due to his age and prior history.  His risk factors cannnot be modified.   Preventive Medicine  Adult topics discussed:  Diet: low calorie, low fat, healthy diet.  Exercise: 5 days a week, at least 30 minutes of aerobic exercise.      Signed, Mina Marble, MD, [facility] 03/16/2014 4:07 PM

## 2014-03-16 NOTE — Patient Instructions (Signed)
Your physician recommends that you continue on your current medications as directed. Please refer to the Current Medication list given to you today.  Your physician wants you to follow-up in: 1 year with Dr. Varanasi. You will receive a reminder letter in the mail two months in advance. If you don't receive a letter, please call our office to schedule the follow-up appointment.  

## 2014-03-19 ENCOUNTER — Telehealth: Payer: Self-pay | Admitting: Interventional Cardiology

## 2014-03-19 NOTE — Telephone Encounter (Signed)
Received request from Nurse fax box, documents faxed for surgical clearance. To: [facility] Fax number: [phone] Attention: [phone]/km

## 2014-03-22 ENCOUNTER — Encounter (HOSPITAL_COMMUNITY): Payer: Self-pay | Admitting: Pharmacy Technician

## 2014-03-22 ENCOUNTER — Other Ambulatory Visit (HOSPITAL_COMMUNITY): Payer: Medicare Other

## 2014-03-24 NOTE — Pre-Procedure Instructions (Signed)
Derrick Edwards  03/24/2014   Your procedure is scheduled on:  August 25  Report to [facility] Admitting at 05:30 AM.  Call this number if you have problems the morning of surgery: [phone]   Remember:   Do not eat food or drink liquids after midnight.   Take these medicines the morning of surgery with A SIP OF WATER: Metoprolol, Omeprazole, Flomax   STOP Fish Oil, Glucose Support, COQ10, Vitamin D, Aspirin today   STOP/ Do not take Aspirin, Aleve, Naproxen, Advil, Ibuprofen, Motrin, Vitamins, Herbs, or Supplements starting today   Do not wear jewelry, make-up or nail polish.  Do not wear lotions, powders, or perfumes. You may wear deodorant.  Do not shave 48 hours prior to surgery. Men may shave face and neck.  Do not bring valuables to the hospital.  [facility] is not responsible for any belongings or valuables.               Contacts, dentures or bridgework may not be worn into surgery.  Leave suitcase in the car. After surgery it may be brought to your room.  For patients admitted to the hospital, discharge time is determined by your treatment team.               Special Instructions: See [facility] Preparing For Surgery   Please read over the following fact sheets that you were given: Pain Booklet, Coughing and Deep Breathing, Blood Transfusion Information, Total Joint Packet and Surgical Site Infection Prevention

## 2014-03-26 ENCOUNTER — Encounter (HOSPITAL_COMMUNITY)
Admission: RE | Admit: 2014-03-26 | Discharge: 2014-03-26 | Disposition: A | Discharge status: Home / Self Care | Payer: Medicare HMO | Source: Ambulatory Visit | Attending: Orthopaedic Surgery | Admitting: Orthopaedic Surgery

## 2014-03-26 ENCOUNTER — Encounter (HOSPITAL_COMMUNITY): Payer: Self-pay

## 2014-03-26 DIAGNOSIS — E119 Type 2 diabetes mellitus without complications: Secondary | ICD-10-CM | POA: Diagnosis not present

## 2014-03-26 DIAGNOSIS — I2510 Atherosclerotic heart disease of native coronary artery without angina pectoris: Secondary | ICD-10-CM | POA: Insufficient documentation

## 2014-03-26 DIAGNOSIS — M1710 Unilateral primary osteoarthritis, unspecified knee: Secondary | ICD-10-CM | POA: Diagnosis present

## 2014-03-26 DIAGNOSIS — K219 Gastro-esophageal reflux disease without esophagitis: Secondary | ICD-10-CM | POA: Diagnosis not present

## 2014-03-26 DIAGNOSIS — Z01818 Encounter for other preprocedural examination: Secondary | ICD-10-CM | POA: Diagnosis present

## 2014-03-26 DIAGNOSIS — Z7982 Long term (current) use of aspirin: Secondary | ICD-10-CM | POA: Diagnosis not present

## 2014-03-26 DIAGNOSIS — Z951 Presence of aortocoronary bypass graft: Secondary | ICD-10-CM | POA: Diagnosis not present

## 2014-03-26 DIAGNOSIS — E785 Hyperlipidemia, unspecified: Secondary | ICD-10-CM | POA: Diagnosis not present

## 2014-03-26 HISTORY — DX: Personal history of urinary calculi: Z87.442

## 2014-03-26 HISTORY — DX: Type 2 diabetes mellitus without complications: E11.9

## 2014-03-26 LAB — BASIC METABOLIC PANEL
Anion gap: 14 (ref 5–15)
BUN: 18 mg/dL (ref 6–23)
CHLORIDE: 102 meq/L (ref 96–112)
CO2: 24 meq/L (ref 19–32)
Calcium: 9.8 mg/dL (ref 8.4–10.5)
Creatinine, Ser: 0.89 mg/dL (ref 0.50–1.35)
GFR calc Af Amer: >90 mL/min (ref >90)
GFR calc non Af Amer: 86 mL/min — ABNORMAL LOW (ref >90)
Glucose, Bld: 140 mg/dL — ABNORMAL HIGH (ref 70–99)
Potassium: 3.9 mEq/L (ref 3.7–5.3)
Sodium: 140 mEq/L (ref 137–147)

## 2014-03-26 LAB — CBC WITH DIFFERENTIAL/PLATELET
BASOS ABS: 0.1 10*3/uL (ref 0.0–0.1)
Basophils Relative: 1 % (ref 0–1)
Eosinophils Absolute: 0.2 10*3/uL (ref 0.0–0.7)
Eosinophils Relative: 3 % (ref 0–5)
HEMATOCRIT: 42 % (ref 39.0–52.0)
Hemoglobin: 13.6 g/dL (ref 13.0–17.0)
LYMPHS PCT: 33 % (ref 12–46)
Lymphs Abs: 1.7 10*3/uL (ref 0.7–4.0)
MCH: 29.1 pg (ref 26.0–34.0)
MCHC: 32.4 g/dL (ref 30.0–36.0)
MCV: 89.7 fL (ref 78.0–100.0)
MONO ABS: 0.6 10*3/uL (ref 0.1–1.0)
Monocytes Relative: 11 % (ref 3–12)
NEUTROS ABS: 2.6 10*3/uL (ref 1.7–7.7)
Neutrophils Relative %: 52 % (ref 43–77)
Platelets: 154 10*3/uL (ref 150–400)
RBC: 4.68 MIL/uL (ref 4.22–5.81)
RDW: 13.2 % (ref 11.5–15.5)
WBC: 5 10*3/uL (ref 4.0–10.5)

## 2014-03-26 LAB — PROTIME-INR
INR: 0.93 (ref 0.00–1.49)
Prothrombin Time: 12.5 seconds (ref 11.6–15.2)

## 2014-03-26 LAB — TYPE AND SCREEN
ABO/RH(D): A POS
ANTIBODY SCREEN: NEGATIVE

## 2014-03-26 LAB — APTT: aPTT: 35 seconds (ref 24–37)

## 2014-03-26 LAB — SURGICAL PCR SCREEN
MRSA, PCR: NEGATIVE
Staphylococcus aureus: NEGATIVE

## 2014-03-26 NOTE — Progress Notes (Signed)
Patient unable to give urine specimen will obtain DOS

## 2014-03-27 NOTE — Progress Notes (Signed)
Anesthesia Chart Review:  Pt is 68 year old male posted for L knee arthroplasty 04/03/14 by Dr. Rhona Raider.   PMH: CAD (CABG 2012), diabetes, GERD, hyperlipidemia  Medications include ASA.   Preoperative labs reviewed.   Chest x-ray reviewed.  EKG 03/16/14 shows NSR.   Note from Dr. Irish Lack, pt's cardiologist, dated 03/16/14, notes pt walked a mile without sx, and that no further testing is needed prior to surgery. He notes pt is low to moderate risk for cardiac complication due to pt age and history. He instructed pt to stop ASA one week prior to surgery.   Request sent to Dr. Hassell Done office for recent stress test or echo if available.   Willeen Cass, FNP-BC [facility]/Anesthesiology Phone: [phone] 03/27/2014 4:09 PM

## 2014-03-29 NOTE — H&P (Signed)
TOTAL KNEE ADMISSION H&P  Patient is being admitted for left total knee arthroplasty.  Subjective:  Chief Complaint:left knee pain.  HPI: Derrick Edwards, 68 y.o. male, has a history of pain and functional disability in the left knee due to arthritis and has failed non-surgical conservative treatments for greater than 12 weeks to includeNSAID's and/or analgesics, corticosteriod injections, viscosupplementation injections, flexibility and strengthening excercises, weight reduction as appropriate and activity modification.  Onset of symptoms was gradual, starting 5 years ago with gradually worsening course since that time. The patient noted prior procedures on the knee to include  arthroscopy on the left knee(s).  Patient currently rates pain in the left knee(s) at 10 out of 10 with activity. Patient has night pain, worsening of pain with activity and weight bearing, pain that interferes with activities of daily living, crepitus and joint swelling.  Patient has evidence of subchondral cysts, subchondral sclerosis, periarticular osteophytes and joint space narrowing by imaging studies.There is no active infection.  Patient Active Problem List   Diagnosis Date Noted  . Coronary atherosclerosis of unspecified type of vessel, native or graft   . Mixed hyperlipidemia    Past Medical History  Diagnosis Date  . Diabetes   . Coronary atherosclerosis of unspecified type of vessel, native or graft     left main CAD, 10/2010-Dr. V and Dr. Cyndia Bent  . Mixed hyperlipidemia   . GERD (gastroesophageal reflux disease)   . Shoulder pain   . Low back pain     associated with degenerative disc disease   . Arthralgia     with Vytorin and myalgias   . History of tobacco use   . Hyperplastic colon polyp     2, 2008, repeat colonoscopy in 2018  . Type II diabetes mellitus   . History of kidney stones     Past Surgical History  Procedure Laterality Date  . Kidney stone surgery  x 3  . Coronary artery bypass  graft  2012  . Cardiac catheterization  2012  . Back surgery      fusion for fracture verterbre     No prescriptions prior to admission   Allergies  Allergen Reactions  . Ezetimibe-Simvastatin     myalgias    History  Substance Use Topics  . Smoking status: Former Smoker -- 1.00 packs/day for 20 years    Types: Cigarettes    Quit date: 08/11/1983  . Smokeless tobacco: Not on file  . Alcohol Use: No    Family History  Problem Relation Age of Onset  . Heart disease Father     and stroke     Review of Systems  Musculoskeletal: Positive for joint pain.       Left knee  All other systems reviewed and are negative.   Objective:  Physical Exam  Constitutional: He is oriented to person, place, and time. He appears well-developed and well-nourished.  HENT:  Head: Normocephalic and atraumatic.  Eyes: Conjunctivae and EOM are normal. Pupils are equal, round, and reactive to light.  Neck: Normal range of motion.  Cardiovascular: Normal rate and regular rhythm.   Respiratory: Effort normal.  GI: Soft.  Musculoskeletal:  Both knees move about 0-120. He has medial joint line pain and crepitation on both. There is no gross deformity. He has trace effusion on the left and no effusion on the right today. Hip motion is full and straight leg raise is negative. Sensation and motor function are intact in his feet with palpable pulses on  both sides. There is no palpable lymphadenopathy behind either knee.   Neurological: He is alert and oriented to person, place, and time.  Skin: Skin is warm and dry.  Psychiatric: He has a normal mood and affect. His behavior is normal. Judgment and thought content normal.    Vital signs in last 24 hours:    Labs:   There is no height or weight on file to calculate BMI.   Imaging Review Plain radiographs demonstrate severe degenerative joint disease of the left knee(s). The overall alignment ismild varus. The bone quality appears to be good for  age and reported activity level.  Assessment/Plan:  End stage arthritis, left knee   The patient history, physical examination, clinical judgment of the provider and imaging studies are consistent with end stage degenerative joint disease of the left knee(s) and total knee arthroplasty is deemed medically necessary. The treatment options including medical management, injection therapy arthroscopy and arthroplasty were discussed at length. The risks and benefits of total knee arthroplasty were presented and reviewed. The risks due to aseptic loosening, infection, stiffness, patella tracking problems, thromboembolic complications and other imponderables were discussed. The patient acknowledged the explanation, agreed to proceed with the plan and consent was signed. Patient is being admitted for inpatient treatment for surgery, pain control, PT, OT, prophylactic antibiotics, VTE prophylaxis, progressive ambulation and ADL's and discharge planning. The patient is planning to be discharged home with home health services

## 2014-04-02 MED ORDER — CEFAZOLIN SODIUM-DEXTROSE 2-3 GM-% IV SOLR
2.0000 g | INTRAVENOUS | Status: AC
  Administered 2014-04-03: 2 g via INTRAVENOUS
  Filled 2014-04-02: qty 50

## 2014-04-02 MED ORDER — CHLORHEXIDINE GLUCONATE 4 % EX LIQD
60.0000 mL | Freq: Once | CUTANEOUS | Status: DC
  Filled 2014-04-02: qty 60

## 2014-04-03 ENCOUNTER — Inpatient Hospital Stay (HOSPITAL_COMMUNITY)
Admission: RE | Admit: 2014-04-03 | Discharge: 2014-04-05 | DRG: 470 | Disposition: A | Discharge status: Home Health | Payer: Medicare HMO | Source: Ambulatory Visit | Attending: Orthopaedic Surgery | Admitting: Orthopaedic Surgery

## 2014-04-03 ENCOUNTER — Encounter (HOSPITAL_COMMUNITY): Payer: Self-pay | Admitting: Surgery

## 2014-04-03 ENCOUNTER — Inpatient Hospital Stay (HOSPITAL_COMMUNITY): Payer: Medicare HMO | Admitting: Certified Registered Nurse Anesthetist

## 2014-04-03 ENCOUNTER — Encounter (HOSPITAL_COMMUNITY): Payer: Medicare HMO | Admitting: Emergency Medicine

## 2014-04-03 ENCOUNTER — Encounter (HOSPITAL_COMMUNITY)
Admission: RE | Disposition: A | Discharge status: Home Health | Payer: Self-pay | Source: Ambulatory Visit | Attending: Orthopaedic Surgery

## 2014-04-03 DIAGNOSIS — Z981 Arthrodesis status: Secondary | ICD-10-CM | POA: Diagnosis not present

## 2014-04-03 DIAGNOSIS — I2510 Atherosclerotic heart disease of native coronary artery without angina pectoris: Secondary | ICD-10-CM | POA: Diagnosis present

## 2014-04-03 DIAGNOSIS — E782 Mixed hyperlipidemia: Secondary | ICD-10-CM | POA: Diagnosis present

## 2014-04-03 DIAGNOSIS — M1710 Unilateral primary osteoarthritis, unspecified knee: Secondary | ICD-10-CM | POA: Diagnosis present

## 2014-04-03 DIAGNOSIS — Z87891 Personal history of nicotine dependence: Secondary | ICD-10-CM

## 2014-04-03 DIAGNOSIS — Z951 Presence of aortocoronary bypass graft: Secondary | ICD-10-CM | POA: Diagnosis not present

## 2014-04-03 DIAGNOSIS — K219 Gastro-esophageal reflux disease without esophagitis: Secondary | ICD-10-CM | POA: Diagnosis present

## 2014-04-03 DIAGNOSIS — E119 Type 2 diabetes mellitus without complications: Secondary | ICD-10-CM | POA: Diagnosis present

## 2014-04-03 DIAGNOSIS — Z888 Allergy status to other drugs, medicaments and biological substances status: Secondary | ICD-10-CM

## 2014-04-03 DIAGNOSIS — M1712 Unilateral primary osteoarthritis, left knee: Secondary | ICD-10-CM | POA: Diagnosis present

## 2014-04-03 HISTORY — PX: TOTAL KNEE ARTHROPLASTY: SHX125

## 2014-04-03 LAB — GLUCOSE, CAPILLARY
GLUCOSE-CAPILLARY: 118 mg/dL — AB (ref 70–99)
GLUCOSE-CAPILLARY: 197 mg/dL — AB (ref 70–99)
Glucose-Capillary: 136 mg/dL — ABNORMAL HIGH (ref 70–99)
Glucose-Capillary: 176 mg/dL — ABNORMAL HIGH (ref 70–99)

## 2014-04-03 SURGERY — ARTHROPLASTY, KNEE, TOTAL
Anesthesia: Regional | Laterality: Left

## 2014-04-03 MED ORDER — DOCUSATE SODIUM 100 MG PO CAPS
100.0000 mg | ORAL_CAPSULE | Freq: Two times a day (BID) | ORAL | Status: DC
  Administered 2014-04-03 – 2014-04-05 (×5): 100 mg via ORAL
  Filled 2014-04-03 (×6): qty 1

## 2014-04-03 MED ORDER — PHENYLEPHRINE HCL 10 MG/ML IJ SOLN
INTRAMUSCULAR | Status: DC | PRN
  Administered 2014-04-03 (×2): 40 ug via INTRAVENOUS
  Administered 2014-04-03: 80 ug via INTRAVENOUS
  Administered 2014-04-03: 100 ug via INTRAVENOUS

## 2014-04-03 MED ORDER — LACTATED RINGERS IV SOLN: INTRAVENOUS | Status: DC

## 2014-04-03 MED ORDER — METOCLOPRAMIDE HCL 5 MG/ML IJ SOLN: 5.0000 mg | Freq: Three times a day (TID) | INTRAMUSCULAR | Status: DC | PRN

## 2014-04-03 MED ORDER — HYDROCODONE-ACETAMINOPHEN 5-325 MG PO TABS
1.0000 | ORAL_TABLET | ORAL | Status: DC | PRN
  Administered 2014-04-03 – 2014-04-05 (×8): 2 via ORAL
  Filled 2014-04-03 (×8): qty 2

## 2014-04-03 MED ORDER — METOCLOPRAMIDE HCL 10 MG PO TABS: 5.0000 mg | ORAL_TABLET | Freq: Three times a day (TID) | ORAL | Status: DC | PRN

## 2014-04-03 MED ORDER — ONDANSETRON HCL 4 MG PO TABS: 4.0000 mg | ORAL_TABLET | Freq: Four times a day (QID) | ORAL | Status: DC | PRN

## 2014-04-03 MED ORDER — NITROGLYCERIN 0.4 MG SL SUBL: 0.4000 mg | SUBLINGUAL_TABLET | SUBLINGUAL | Status: DC | PRN

## 2014-04-03 MED ORDER — METHOCARBAMOL 500 MG PO TABS
500.0000 mg | ORAL_TABLET | Freq: Four times a day (QID) | ORAL | Status: DC | PRN
  Administered 2014-04-04 – 2014-04-05 (×3): 500 mg via ORAL
  Filled 2014-04-03 (×3): qty 1

## 2014-04-03 MED ORDER — FENTANYL CITRATE 0.05 MG/ML IJ SOLN
INTRAMUSCULAR | Status: DC | PRN
  Administered 2014-04-03 (×4): 50 ug via INTRAVENOUS

## 2014-04-03 MED ORDER — ALUM & MAG HYDROXIDE-SIMETH 200-200-20 MG/5ML PO SUSP: 30.0000 mL | ORAL | Status: DC | PRN

## 2014-04-03 MED ORDER — LIDOCAINE HCL (CARDIAC) 20 MG/ML IV SOLN
INTRAVENOUS | Status: AC
  Filled 2014-04-03: qty 5

## 2014-04-03 MED ORDER — ONDANSETRON HCL 4 MG/2ML IJ SOLN: 4.0000 mg | Freq: Four times a day (QID) | INTRAMUSCULAR | Status: DC | PRN

## 2014-04-03 MED ORDER — ROCURONIUM BROMIDE 50 MG/5ML IV SOLN
INTRAVENOUS | Status: AC
  Filled 2014-04-03: qty 1

## 2014-04-03 MED ORDER — EPHEDRINE SULFATE 50 MG/ML IJ SOLN
INTRAMUSCULAR | Status: DC | PRN
  Administered 2014-04-03: 10 mg via INTRAVENOUS
  Administered 2014-04-03 (×2): 5 mg via INTRAVENOUS

## 2014-04-03 MED ORDER — OXYCODONE HCL 5 MG PO TABS: 5.0000 mg | ORAL_TABLET | Freq: Once | ORAL | Status: DC | PRN

## 2014-04-03 MED ORDER — ROPIVACAINE HCL 5 MG/ML IJ SOLN
INTRAMUSCULAR | Status: DC | PRN
  Administered 2014-04-03: 30 mL via PERINEURAL

## 2014-04-03 MED ORDER — FENTANYL CITRATE 0.05 MG/ML IJ SOLN
INTRAMUSCULAR | Status: AC
  Filled 2014-04-03: qty 5

## 2014-04-03 MED ORDER — 0.9 % SODIUM CHLORIDE (POUR BTL) OPTIME
TOPICAL | Status: DC | PRN
  Administered 2014-04-03: 1000 mL

## 2014-04-03 MED ORDER — DEXAMETHASONE SODIUM PHOSPHATE 4 MG/ML IJ SOLN
INTRAMUSCULAR | Status: DC | PRN
  Administered 2014-04-03: 4 mg via INTRAVENOUS

## 2014-04-03 MED ORDER — BUPIVACAINE LIPOSOME 1.3 % IJ SUSP
INTRAMUSCULAR | Status: DC | PRN
  Administered 2014-04-03: 20 mL

## 2014-04-03 MED ORDER — INSULIN ASPART 100 UNIT/ML ~~LOC~~ SOLN
0.0000 [IU] | Freq: Three times a day (TID) | SUBCUTANEOUS | Status: DC
  Administered 2014-04-03 – 2014-04-04 (×2): 3 [IU] via SUBCUTANEOUS
  Administered 2014-04-04 – 2014-04-05 (×3): 2 [IU] via SUBCUTANEOUS

## 2014-04-03 MED ORDER — ONDANSETRON HCL 4 MG/2ML IJ SOLN
INTRAMUSCULAR | Status: DC | PRN
  Administered 2014-04-03: 4 mg via INTRAVENOUS

## 2014-04-03 MED ORDER — MENTHOL 3 MG MT LOZG: 1.0000 | LOZENGE | OROMUCOSAL | Status: DC | PRN

## 2014-04-03 MED ORDER — ONDANSETRON HCL 4 MG/2ML IJ SOLN
INTRAMUSCULAR | Status: AC
  Filled 2014-04-03: qty 2

## 2014-04-03 MED ORDER — MIDAZOLAM HCL 5 MG/5ML IJ SOLN
INTRAMUSCULAR | Status: DC | PRN
  Administered 2014-04-03: 2 mg via INTRAVENOUS

## 2014-04-03 MED ORDER — DEXAMETHASONE SODIUM PHOSPHATE 4 MG/ML IJ SOLN
INTRAMUSCULAR | Status: AC
  Filled 2014-04-03: qty 1

## 2014-04-03 MED ORDER — HYDROMORPHONE HCL PF 1 MG/ML IJ SOLN: 0.5000 mg | INTRAMUSCULAR | Status: DC | PRN

## 2014-04-03 MED ORDER — PANTOPRAZOLE SODIUM 40 MG PO TBEC
40.0000 mg | DELAYED_RELEASE_TABLET | Freq: Every day | ORAL | Status: DC
  Administered 2014-04-03 – 2014-04-05 (×3): 40 mg via ORAL
  Filled 2014-04-03 (×3): qty 1

## 2014-04-03 MED ORDER — METFORMIN HCL ER 500 MG PO TB24
500.0000 mg | ORAL_TABLET | Freq: Two times a day (BID) | ORAL | Status: DC
  Administered 2014-04-03 – 2014-04-05 (×4): 500 mg via ORAL
  Filled 2014-04-03 (×6): qty 1

## 2014-04-03 MED ORDER — METHOCARBAMOL 1000 MG/10ML IJ SOLN
500.0000 mg | Freq: Four times a day (QID) | INTRAVENOUS | Status: DC | PRN
  Filled 2014-04-03: qty 5

## 2014-04-03 MED ORDER — ACETAMINOPHEN 325 MG PO TABS: 650.0000 mg | ORAL_TABLET | Freq: Four times a day (QID) | ORAL | Status: DC | PRN

## 2014-04-03 MED ORDER — PHENOL 1.4 % MT LIQD: 1.0000 | OROMUCOSAL | Status: DC | PRN

## 2014-04-03 MED ORDER — METOPROLOL TARTRATE 25 MG PO TABS
25.0000 mg | ORAL_TABLET | Freq: Two times a day (BID) | ORAL | Status: DC
  Administered 2014-04-03 – 2014-04-05 (×4): 25 mg via ORAL
  Filled 2014-04-03 (×5): qty 1

## 2014-04-03 MED ORDER — ACETAMINOPHEN 650 MG RE SUPP: 650.0000 mg | Freq: Four times a day (QID) | RECTAL | Status: DC | PRN

## 2014-04-03 MED ORDER — ASPIRIN EC 325 MG PO TBEC
325.0000 mg | DELAYED_RELEASE_TABLET | Freq: Two times a day (BID) | ORAL | Status: DC
  Administered 2014-04-04 – 2014-04-05 (×3): 325 mg via ORAL
  Filled 2014-04-03 (×5): qty 1

## 2014-04-03 MED ORDER — SODIUM CHLORIDE 0.9 % IR SOLN
Status: DC | PRN
  Administered 2014-04-03: 1000 mL

## 2014-04-03 MED ORDER — BUPIVACAINE LIPOSOME 1.3 % IJ SUSP
20.0000 mL | INTRAMUSCULAR | Status: DC
  Filled 2014-04-03: qty 20

## 2014-04-03 MED ORDER — OXYCODONE HCL 5 MG/5ML PO SOLN: 5.0000 mg | Freq: Once | ORAL | Status: DC | PRN

## 2014-04-03 MED ORDER — LISINOPRIL 2.5 MG PO TABS
2.5000 mg | ORAL_TABLET | Freq: Every day | ORAL | Status: DC
  Administered 2014-04-03 – 2014-04-05 (×3): 2.5 mg via ORAL
  Filled 2014-04-03 (×4): qty 1

## 2014-04-03 MED ORDER — COQ10 100 MG PO CAPS: 100.0000 mg | ORAL_CAPSULE | Freq: Every day | ORAL | Status: DC

## 2014-04-03 MED ORDER — HYDROMORPHONE HCL PF 1 MG/ML IJ SOLN
INTRAMUSCULAR | Status: AC
  Filled 2014-04-03: qty 1

## 2014-04-03 MED ORDER — PROPOFOL 10 MG/ML IV BOLUS
INTRAVENOUS | Status: DC | PRN
  Administered 2014-04-03: 140 mg via INTRAVENOUS
  Administered 2014-04-03: 30 mg via INTRAVENOUS

## 2014-04-03 MED ORDER — ATORVASTATIN CALCIUM 10 MG PO TABS: 10.0000 mg | ORAL_TABLET | Freq: Every day | ORAL | Status: DC

## 2014-04-03 MED ORDER — MIDAZOLAM HCL 2 MG/2ML IJ SOLN
INTRAMUSCULAR | Status: AC
  Filled 2014-04-03: qty 2

## 2014-04-03 MED ORDER — LIDOCAINE HCL (CARDIAC) 20 MG/ML IV SOLN: INTRAVENOUS | Status: DC | PRN

## 2014-04-03 MED ORDER — BISACODYL 5 MG PO TBEC: 5.0000 mg | DELAYED_RELEASE_TABLET | Freq: Every day | ORAL | Status: DC | PRN

## 2014-04-03 MED ORDER — HYDROMORPHONE HCL PF 1 MG/ML IJ SOLN
0.2500 mg | INTRAMUSCULAR | Status: DC | PRN
  Administered 2014-04-03: 0.5 mg via INTRAVENOUS

## 2014-04-03 MED ORDER — TAMSULOSIN HCL 0.4 MG PO CAPS
0.4000 mg | ORAL_CAPSULE | Freq: Every day | ORAL | Status: DC
  Administered 2014-04-04: 0.4 mg via ORAL
  Filled 2014-04-03 (×2): qty 1

## 2014-04-03 MED ORDER — DIPHENHYDRAMINE HCL 12.5 MG/5ML PO ELIX: 12.5000 mg | ORAL_SOLUTION | ORAL | Status: DC | PRN

## 2014-04-03 MED ORDER — ROSUVASTATIN CALCIUM 5 MG PO TABS
5.0000 mg | ORAL_TABLET | Freq: Every day | ORAL | Status: DC
  Administered 2014-04-03 – 2014-04-04 (×2): 5 mg via ORAL
  Filled 2014-04-03 (×3): qty 1

## 2014-04-03 MED ORDER — CEFAZOLIN SODIUM-DEXTROSE 2-3 GM-% IV SOLR
2.0000 g | Freq: Four times a day (QID) | INTRAVENOUS | Status: AC
  Administered 2014-04-03 (×2): 2 g via INTRAVENOUS
  Filled 2014-04-03 (×2): qty 50

## 2014-04-03 MED ORDER — LACTATED RINGERS IV SOLN
INTRAVENOUS | Status: DC | PRN
  Administered 2014-04-03: 07:00:00 via INTRAVENOUS

## 2014-04-03 MED ORDER — PROPOFOL 10 MG/ML IV BOLUS
INTRAVENOUS | Status: AC
  Filled 2014-04-03: qty 20

## 2014-04-03 MED ORDER — SODIUM CHLORIDE 0.9 % IJ SOLN
INTRAMUSCULAR | Status: DC | PRN
  Administered 2014-04-03: 20 mL via INTRAVENOUS

## 2014-04-03 MED ORDER — LINAGLIPTIN 5 MG PO TABS
5.0000 mg | ORAL_TABLET | Freq: Every day | ORAL | Status: DC
  Administered 2014-04-03 – 2014-04-05 (×3): 5 mg via ORAL
  Filled 2014-04-03 (×3): qty 1

## 2014-04-03 MED ORDER — VITAMIN D3 25 MCG (1000 UNIT) PO TABS
1000.0000 [IU] | ORAL_TABLET | Freq: Every day | ORAL | Status: DC
  Administered 2014-04-04 – 2014-04-05 (×2): 1000 [IU] via ORAL
  Filled 2014-04-03 (×3): qty 1

## 2014-04-03 SURGICAL SUPPLY — 68 items
APL SKNCLS STERI-STRIP NONHPOA (GAUZE/BANDAGES/DRESSINGS) ×1
BANDAGE ELASTIC 4 VELCRO ST LF (GAUZE/BANDAGES/DRESSINGS) ×2 IMPLANT
BANDAGE ELASTIC 6 VELCRO ST LF (GAUZE/BANDAGES/DRESSINGS) ×2 IMPLANT
BANDAGE ESMARK 6X9 LF (GAUZE/BANDAGES/DRESSINGS) ×1 IMPLANT
BENZOIN TINCTURE PRP APPL 2/3 (GAUZE/BANDAGES/DRESSINGS) ×2 IMPLANT
BLADE SAG 18X100X1.27 (BLADE) IMPLANT
BLADE SAGITTAL 13X1.27X60 (BLADE) IMPLANT
BLADE SAGITTAL 25.0X1.19X90 (BLADE) ×2 IMPLANT
BLADE SURG ROTATE 9660 (MISCELLANEOUS) IMPLANT
BNDG CMPR 9X6 STRL LF SNTH (GAUZE/BANDAGES/DRESSINGS) ×1
BNDG CMPR MED 10X6 ELC LF (GAUZE/BANDAGES/DRESSINGS) ×1
BNDG ELASTIC 6X10 VLCR STRL LF (GAUZE/BANDAGES/DRESSINGS) ×2 IMPLANT
BNDG ESMARK 6X9 LF (GAUZE/BANDAGES/DRESSINGS) ×2
BNDG GAUZE ELAST 4 BULKY (GAUZE/BANDAGES/DRESSINGS) ×2 IMPLANT
BOWL SMART MIX CTS (DISPOSABLE) ×2 IMPLANT
CAPT RP KNEE ×2 IMPLANT
CEMENT HV SMART SET (Cement) ×4 IMPLANT
CLSR STERI-STRIP ANTIMIC 1/2X4 (GAUZE/BANDAGES/DRESSINGS) ×1 IMPLANT
COVER SURGICAL LIGHT HANDLE (MISCELLANEOUS) ×2 IMPLANT
CUFF TOURNIQUET SINGLE 34IN LL (TOURNIQUET CUFF) ×2 IMPLANT
CUFF TOURNIQUET SINGLE 44IN (TOURNIQUET CUFF) IMPLANT
DRAPE EXTREMITY T 121X128X90 (DRAPE) ×2 IMPLANT
DRAPE PROXIMA HALF (DRAPES) ×2 IMPLANT
DRAPE U-SHAPE 47X51 STRL (DRAPES) ×2 IMPLANT
DRSG ADAPTIC 3X8 NADH LF (GAUZE/BANDAGES/DRESSINGS) ×2 IMPLANT
DRSG PAD ABDOMINAL 8X10 ST (GAUZE/BANDAGES/DRESSINGS) ×2 IMPLANT
DURAPREP 26ML APPLICATOR (WOUND CARE) ×2 IMPLANT
ELECT REM PT RETURN 9FT ADLT (ELECTROSURGICAL) ×2
ELECTRODE REM PT RTRN 9FT ADLT (ELECTROSURGICAL) ×1 IMPLANT
GAUZE SPONGE 4X4 12PLY STRL (GAUZE/BANDAGES/DRESSINGS) ×2 IMPLANT
GLOVE BIO SURGEON STRL SZ8 (GLOVE) ×4 IMPLANT
GLOVE BIOGEL M 6.5 STRL (GLOVE) ×4 IMPLANT
GLOVE BIOGEL PI IND STRL 6.5 (GLOVE) ×2 IMPLANT
GLOVE BIOGEL PI IND STRL 8 (GLOVE) ×2 IMPLANT
GLOVE BIOGEL PI INDICATOR 6.5 (GLOVE) ×2
GLOVE BIOGEL PI INDICATOR 8 (GLOVE) ×2
GOWN STRL REUS W/ TWL LRG LVL3 (GOWN DISPOSABLE) ×1 IMPLANT
GOWN STRL REUS W/ TWL XL LVL3 (GOWN DISPOSABLE) ×2 IMPLANT
GOWN STRL REUS W/TWL 2XL LVL3 (GOWN DISPOSABLE) ×2 IMPLANT
GOWN STRL REUS W/TWL LRG LVL3 (GOWN DISPOSABLE) ×2
GOWN STRL REUS W/TWL XL LVL3 (GOWN DISPOSABLE) ×4
HANDPIECE INTERPULSE COAX TIP (DISPOSABLE) ×2
HOOD PEEL AWAY FACE SHEILD DIS (HOOD) ×4 IMPLANT
IMMOBILIZER KNEE 20 (SOFTGOODS) IMPLANT
IMMOBILIZER KNEE 22 UNIV (SOFTGOODS) ×2 IMPLANT
IMMOBILIZER KNEE 24 THIGH 36 (MISCELLANEOUS) IMPLANT
IMMOBILIZER KNEE 24 UNIV (MISCELLANEOUS)
KIT BASIN OR (CUSTOM PROCEDURE TRAY) ×2 IMPLANT
KIT ROOM TURNOVER OR (KITS) ×2 IMPLANT
MANIFOLD NEPTUNE II (INSTRUMENTS) ×2 IMPLANT
NEEDLE HYPO 21X1 ECLIPSE (NEEDLE) ×2 IMPLANT
NS IRRIG 1000ML POUR BTL (IV SOLUTION) ×2 IMPLANT
PACK TOTAL JOINT (CUSTOM PROCEDURE TRAY) ×2 IMPLANT
PAD ARMBOARD 7.5X6 YLW CONV (MISCELLANEOUS) ×4 IMPLANT
SET HNDPC FAN SPRY TIP SCT (DISPOSABLE) ×1 IMPLANT
STAPLER VISISTAT 35W (STAPLE) ×2 IMPLANT
SUCTION FRAZIER TIP 10 FR DISP (SUCTIONS) IMPLANT
SUT MNCRL AB 3-0 PS2 18 (SUTURE) IMPLANT
SUT VIC AB 0 CT1 27 (SUTURE) ×4
SUT VIC AB 0 CT1 27XBRD ANBCTR (SUTURE) ×2 IMPLANT
SUT VIC AB 2-0 CT1 27 (SUTURE) ×4
SUT VIC AB 2-0 CT1 TAPERPNT 27 (SUTURE) ×2 IMPLANT
SUT VLOC 180 0 24IN GS25 (SUTURE) ×2 IMPLANT
SYR 50ML LL SCALE MARK (SYRINGE) ×2 IMPLANT
TOWEL OR 17X24 6PK STRL BLUE (TOWEL DISPOSABLE) ×2 IMPLANT
TOWEL OR 17X26 10 PK STRL BLUE (TOWEL DISPOSABLE) ×2 IMPLANT
TRAY FOLEY CATH 14FR (SET/KITS/TRAYS/PACK) IMPLANT
WATER STERILE IRR 1000ML POUR (IV SOLUTION) IMPLANT

## 2014-04-03 NOTE — Progress Notes (Signed)
Utilization review completed.  

## 2014-04-03 NOTE — Op Note (Signed)
PREOP DIAGNOSIS: DJD LEFT KNEE POSTOP DIAGNOSIS:  same PROCEDURE: LEFT TKR ANESTHESIA: General and block ATTENDING SURGEON: [place] G ASSISTANT: Loni Dolly PA  INDICATIONS FOR PROCEDURE: Derrick Edwards is a 68 y.o. male who has struggled for a long time with pain due to degenerative arthritis of the left knee.  The patient has failed many conservative non-operative measures and at this point has pain which limits the ability to sleep and walk.  The patient is offered total knee replacement.  Informed operative consent was obtained after discussion of possible risks of anesthesia, infection, neurovascular injury, DVT, and death.  The importance of the post-operative rehabilitation protocol to optimize result was stressed extensively with the patient.  SUMMARY OF FINDINGS AND PROCEDURE:  Derrick Edwards was taken to the operative suite where under the above anesthesia a left knee replacement was performed.  There were advanced degenerative changes and the bone quality was excellent.  We used the DePuy LCS system and placed size standard plus femur, 4 tibia, 41 mm all polyethylene patella, and a size 10 mm spacer.  Loni Dolly PA-C assisted throughout and was invaluable to the completion of the case in that he helped retract and maintain exposure while I placed the components.  He also helped close thereby minimizing OR time.  The patient was admitted for appropriate post-op care to include perioperative antibiotics and mechanical and pharmacologic measures for DVT prophylaxis.  DESCRIPTION OF PROCEDURE:  Derrick Edwards was taken to the operative suite where the above anesthesia was applied.  The patient was positioned supine and prepped and draped in normal sterile fashion.  An appropriate time out was performed.  After the administration of kefzol pre-op antibiotic the leg was elevated and exsanguinated and a tourniquet inflated.  A standard longitudinal incision was made on the anterior knee.   Dissection was carried down to the extensor mechanism.  All appropriate anti-infective measures were used including the pre-operative antibiotic, betadine impregnated drape, and closed hooded exhaust systems for each member of the surgical team.  A medial parapatellar incision was made in the extensor mechanism and the knee cap flipped and the knee flexed.  Some residual meniscal tissues were removed along with any remaining ACL/PCL tissue.  A guide was placed on the tibia and a flat cut was made on it's superior surface.  An intramedullary guide was placed in the femur and was utilized to make anterior and posterior cuts creating an appropriate flexion gap.  A second intramedullary guide was placed in the femur to make a distal cut properly balancing the knee with an extension gap equal to the flexion gap.  The three bones sized to the above mentioned sizes and the appropriate guides were placed and utilized.  A trial reduction was done and the knee easily came to full extension and the patella tracked well on flexion.  The trial components were removed and all bones were cleaned with pulsatile lavage and then dried thoroughly.  Cement was mixed and was pressurized onto the bones followed by placement of the aforementioned components.  Excess cement was trimmed and pressure was held on the components until the cement had hardened.  The tourniquet was deflated and a small amount of bleeding was controlled with cautery and pressure.  The knee was irrigated thoroughly.  The extensor mechanism was re-approximated with V-loc suture in running fashion.  The knee was flexed and the repair was solid.  The subcutaneous tissues were re-approximated with #0 and #2-0 vicryl and the  skin closed with a subcuticular stitch and steristrips.  A sterile dressing was applied.  Intraoperative fluids, EBL, and tourniquet time can be obtained from anesthesia records.  DISPOSITION:  The patient was taken to recovery room in stable  condition and admitted for appropriate post-op care to include peri-operative antibiotic and DVT prophylaxis with mechanical and pharmacologic measures.  Derrick Edwards G 04/03/2014, 9:15 AM

## 2014-04-03 NOTE — Plan of Care (Signed)
Problem: Consults Goal: Diagnosis- Total Joint Replacement Primary Total Knee Left     

## 2014-04-03 NOTE — Progress Notes (Signed)
Orthopedic Tech Progress Note Patient Details:  Derrick Edwards [birth year] [id number] CPM applied to LLE with appropriate settings. OHF applied to bed.  CPM Left Knee CPM Left Knee: On Left Knee Flexion (Degrees): 60 Left Knee Extension (Degrees): 0   Asia R Thompson 04/03/2014, 10:20 AM

## 2014-04-03 NOTE — Anesthesia Preprocedure Evaluation (Addendum)
Anesthesia Evaluation  Patient identified by MRN, date of birth, ID band Patient awake    Reviewed: Allergy & Precautions, H&P , NPO status , Patient's Chart, lab work & pertinent test results, reviewed documented beta blocker date and time   Airway Mallampati: II TM Distance: >3 FB Neck ROM: Full    Dental no notable dental hx. (+) Edentulous Upper, Edentulous Lower, Dental Advisory Given   Pulmonary neg pulmonary ROS, former smoker,  breath sounds clear to auscultation  Pulmonary exam normal       Cardiovascular + CAD Rhythm:Regular Rate:Normal     Neuro/Psych negative neurological ROS  negative psych ROS   GI/Hepatic Neg liver ROS, GERD-  Medicated and Controlled,  Endo/Other  diabetes, Type 2, Oral Hypoglycemic Agents  Renal/GU negative Renal ROS  negative genitourinary   Musculoskeletal   Abdominal   Peds  Hematology negative hematology ROS (+)   Anesthesia Other Findings   Reproductive/Obstetrics negative OB ROS                          Anesthesia Physical Anesthesia Plan  ASA: III  Anesthesia Plan: General and Regional   Post-op Pain Management:    Induction: Intravenous  Airway Management Planned: LMA  Additional Equipment:   Intra-op Plan:   Post-operative Plan: Extubation in OR  Informed Consent: I have reviewed the patients History and Physical, chart, labs and discussed the procedure including the risks, benefits and alternatives for the proposed anesthesia with the patient or authorized representative who has indicated his/her understanding and acceptance.   Dental advisory given  Plan Discussed with: CRNA  Anesthesia Plan Comments:         Anesthesia Quick Evaluation

## 2014-04-03 NOTE — Evaluation (Signed)
Physical Therapy Evaluation Patient Details Name: Derrick Edwards MRN: [id number] DOB: January 27, 1946 Today's Date: 04/03/2014   History of Present Illness  Pt admitted for L TKA.  Clinical Impression  Pt is s/p TKA resulting in the deficits listed below (see PT Problem List). Pt tolerated OOB mobility well for first time. Pt will benefit from skilled PT to increase their independence and safety with mobility to allow discharge to the venue listed below. Pt wife avail 24/7 for assist.     Follow Up Recommendations Home health PT;Supervision/Assistance - 24 hour    Equipment Recommendations  None recommended by PT    Recommendations for Other Services       Precautions / Restrictions Precautions Precautions: Fall Required Braces or Orthoses: Knee Immobilizer - Left Knee Immobilizer - Left: On when out of bed or walking Restrictions Weight Bearing Restrictions: Yes LLE Weight Bearing: Weight bearing as tolerated      Mobility  Bed Mobility Overal bed mobility: Needs Assistance Bed Mobility: Supine to Sit     Supine to sit: Min assist     General bed mobility comments: minA for L LE management  Transfers Overall transfer level: Needs assistance Equipment used: Rolling walker (2 wheeled) Transfers: Sit to/from Stand Sit to Stand: Min assist Stand pivot transfers: Min assist       General transfer comment: v/c's for sequencing  Ambulation/Gait                Stairs            Wheelchair Mobility    Modified Rankin (Stroke Patients Only)       Balance Overall balance assessment: Needs assistance             Standing balance comment: pt requires RW due to L TKA                             Pertinent Vitals/Pain Pain Assessment: No/denies pain    Home Living Family/patient expects to be discharged to:: Private residence Living Arrangements: Spouse/significant other Available Help at Discharge: Family;Available 24  hours/day Type of Home: House Home Access: Stairs to enter Entrance Stairs-Rails: Right;Left (can not reach both) Entrance Stairs-Number of Steps: 6 Home Layout: One level Home Equipment: Walker - 2 wheels (CPM)      Prior Function Level of Independence: Independent               Hand Dominance   Dominant Hand: Right    Extremity/Trunk Assessment   Upper Extremity Assessment: Overall WFL for tasks assessed           Lower Extremity Assessment: LLE deficits/detail   LLE Deficits / Details: pt unable to achieve quad set, able to complete 30 deg active knee flex  Cervical / Trunk Assessment: Normal  Communication   Communication: No difficulties  Cognition Arousal/Alertness: Awake/alert Behavior During Therapy: WFL for tasks assessed/performed Overall Cognitive Status: Within Functional Limits for tasks assessed                      General Comments      Exercises Total Joint Exercises Ankle Circles/Pumps: AROM;10 reps;Both Quad Sets: AAROM;Left;10 reps;Supine Heel Slides: AROM;Left;5 reps;Supine Goniometric ROM: 30 deg L knee flex active      Assessment/Plan    PT Assessment Patient needs continued PT services  PT Diagnosis Difficulty walking   PT Problem List Decreased strength;Decreased range of motion;Decreased activity tolerance;Decreased balance;Decreased  mobility;Decreased knowledge of use of DME  PT Treatment Interventions DME instruction;Gait training;Stair training;Functional mobility training;Therapeutic activities;Therapeutic exercise   PT Goals (Current goals can be found in the Care Plan section) Acute Rehab PT Goals Patient Stated Goal: home PT Goal Formulation: With patient Time For Goal Achievement: 04/10/14 Potential to Achieve Goals: Good    Frequency 7X/week   Barriers to discharge        Co-evaluation               End of Session Equipment Utilized During Treatment: Gait belt;Left knee immobilizer Activity  Tolerance: Patient tolerated treatment well Patient left: in chair;with call bell/phone within reach;with family/visitor present Nurse Communication: Mobility status         Time: 9295-7473 PT Time Calculation (min): 28 min   Charges:   PT Evaluation $Initial PT Evaluation Tier I: 1 Procedure PT Treatments $Therapeutic Activity: 8-22 mins   PT G CodesKingsley Edwards 04/03/2014, 3:58 PM  Derrick Edwards, PT, DPT Pager #: [phone] Office #: [phone]

## 2014-04-03 NOTE — Transfer of Care (Signed)
Immediate Anesthesia Transfer of Care Note  Patient: Derrick Edwards  Procedure(s) Performed: Procedure(s): TOTAL KNEE ARTHROPLASTY (Left)  Patient Location: PACU  Anesthesia Type:GA combined with regional for post-op pain  Level of Consciousness: awake, alert  and oriented  Airway & Oxygen Therapy: Patient Spontanous Breathing and Patient connected to nasal cannula oxygen  Post-op Assessment: Report given to PACU RN and Post -op Vital signs reviewed and stable  Post vital signs: Reviewed and stable  Complications: No apparent anesthesia complications

## 2014-04-03 NOTE — Evaluation (Addendum)
Occupational Therapy Evaluation Patient Details Name: Derrick Edwards MRN: [id number] DOB: Jan 13, 1946 Today's Date: 04/03/2014    History of Present Illness Pt admitted for L TKA.   Clinical Impression   Pt s/p above. Pt independent with ADLs, PTA. Feel pt will benefit from acute OT to increase independence prior to d/c.     Follow Up Recommendations  No OT follow up;Supervision - Intermittent    Equipment Recommendations  None recommended by OT    Recommendations for Other Services       Precautions / Restrictions Precautions Precautions: Fall Required Braces or Orthoses: Knee Immobilizer - Left Knee Immobilizer - Left: On when out of bed or walking Restrictions Weight Bearing Restrictions: Yes LLE Weight Bearing: Weight bearing as tolerated      Mobility Bed Mobility     General bed mobility comments: not assessed  Transfers Overall transfer level: Needs assistance Equipment used: Rolling walker (2 wheeled) Transfers: Sit to/from Stand Sit to Stand: Min guard       General transfer comment: Min guard for safety.          ADL Overall ADL's : Needs assistance/impaired     Grooming: Wash/dry hands;Standing;Min assist                 Lower Body Dressing: Minimal assistance;Sit to/from stand   Toilet Transfer: Ambulation;RW;Comfort height toilet;Minimal assistance   Toileting- Clothing Manipulation and Hygiene: Moderate assistance (assist for balance while standing due to knee buckle)       Functional mobility during ADLs: Minimal assistance;Rolling walker;Cueing for sequencing (assist to help  support left knee from buckling) General ADL Comments: Educated on safety tips for home (safe shoewear, pets, use of bag on walker, sitting for LB ADLs). Educated on tub transfer techniques and options for shower chair. Recommended not stepping over tub anytime soon. Educated that AE is available for LB ADLs if it becomes an issue but pt able to reach left  sock today. Educated on knee immobilizer. Explained benefit of reaching to left sock as it allows knee to bend.     Vision                     Perception     Praxis      Pertinent Vitals/Pain Pain Assessment: No/denies pain     Hand Dominance Right   Extremity/Trunk Assessment Upper Extremity Assessment Upper Extremity Assessment: Overall WFL for tasks assessed   Lower Extremity Assessment Lower Extremity Assessment: Defer to PT evaluation LLE Deficits / Details: pt unable to achieve quad set, able to complete 30 deg active knee flex   Cervical / Trunk Assessment Cervical / Trunk Assessment: Normal   Communication Communication Communication: No difficulties   Cognition Arousal/Alertness: Awake/alert Behavior During Therapy: WFL for tasks assessed/performed Overall Cognitive Status: Within Functional Limits for tasks assessed                     General Comments          Shoulder Instructions      Home Living Family/patient expects to be discharged to:: Private residence Living Arrangements: Spouse/significant other Available Help at Discharge: Family;Available 24 hours/day Type of Home: House Home Access: Stairs to enter [employer] of Steps: 6 Entrance Stairs-Rails: Right;Left Home Layout: One level     Bathroom Shower/Tub: Occupational psychologist:  (has both)     Home Equipment: Environmental consultant - 2 wheels;Bedside commode (CPM)  Prior Functioning/Environment Level of Independence: Independent             OT Diagnosis: Acute pain   OT Problem List: Decreased strength;Impaired balance (sitting and/or standing);Decreased knowledge of use of DME or AE;Decreased knowledge of precautions;Pain   OT Treatment/Interventions: Self-care/ADL training;DME and/or AE instruction;Therapeutic activities;Patient/family education;Balance training    OT Goals(Current goals can be found in the care plan section) Acute Rehab  OT Goals Patient Stated Goal: not stated OT Goal Formulation: With patient Time For Goal Achievement: 04/10/14 Potential to Achieve Goals: Good ADL Goals Pt Will Perform Lower Body Dressing: with modified independence;sit to/from stand Pt Will Transfer to Toilet: with modified independence;ambulating (3 in 1 over commode) Pt Will Perform Toileting - Clothing Manipulation and hygiene: with modified independence;sit to/from stand Pt Will Perform Tub/Shower Transfer: Tub transfer;with supervision;ambulating;rolling walker (tub equipment tbd)  OT Frequency: Min 2X/week   Barriers to D/C:            Co-evaluation              End of Session Equipment Utilized During Treatment: Gait belt;Rolling walker;Left knee immobilizer CPM Left Knee CPM Left Knee: Off  Activity Tolerance: Patient tolerated treatment well Patient left: in chair;with call bell/phone within reach;with family/visitor present   Time: 4782-9562 OT Time Calculation (min): 23 min Charges:  OT General Charges $OT Visit: 1 Procedure OT Evaluation $Initial OT Evaluation Tier I: 1 Procedure OT Treatments $Self Care/Home Management : 8-22 mins G-CodesBenito Mccreedy OTR/L [phone] 04/03/2014, 6:13 PM

## 2014-04-03 NOTE — Anesthesia Postprocedure Evaluation (Signed)
  Anesthesia Post-op Note  Patient: Derrick Edwards  Procedure(s) Performed: Procedure(s): TOTAL KNEE ARTHROPLASTY (Left)  Patient Location: PACU  Anesthesia Type:General and block  Level of Consciousness: awake and alert   Airway and Oxygen Therapy: Patient Spontanous Breathing  Post-op Pain: none  Post-op Assessment: Post-op Vital signs reviewed, Patient's Cardiovascular Status Stable and Respiratory Function Stable  Post-op Vital Signs: Reviewed  Filed Vitals:   04/03/14 1249  BP: 116/58  Pulse: 72  Temp: 36.3 C  Resp: 20    Complications: No apparent anesthesia complications

## 2014-04-03 NOTE — Anesthesia Procedure Notes (Addendum)
Anesthesia Regional Block:  Femoral nerve block  Pre-Anesthetic Checklist: ,, timeout performed, Correct Patient, Correct Site, Correct Laterality, Correct Procedure, Correct Position, site marked, Risks and benefits discussed, pre-op evaluation,  At surgeon's request and post-op pain management  Laterality: Left  Prep: Maximum Sterile Barrier Precautions used and chloraprep       Needles:  Injection technique: Single-shot  Needle Type: Echogenic Stimulator Needle     Needle Length: 5cm 5 cm Needle Gauge: 22 and 22 G    Additional Needles:  Procedures: ultrasound guided (picture in chart) and nerve stimulator Femoral nerve block  Nerve Stimulator or Paresthesia:  Response: Patellar respose,   Additional Responses:   Narrative:  Start time: 04/03/2014 7:00 AM End time: 04/03/2014 7:10 AM Injection made incrementally with aspirations every 5 mL. Anesthesiologist: Kemon Devincenzi,MD  Additional Notes: 2% Lidocaine skin wheel.

## 2014-04-03 NOTE — Progress Notes (Signed)
PHARMACIST - PHYSICIAN ORDER COMMUNICATION  CONCERNING: P&T Medication Policy on Herbal Medications  DESCRIPTION:  This patient's order for:  CoQ10 has been noted.  This product(s) is classified as an "herbal" or natural product. Due to a lack of definitive safety studies or FDA approval, nonstandard manufacturing practices, plus the potential risk of unknown drug-drug interactions while on inpatient medications, the Pharmacy and Therapeutics Committee does not permit the use of "herbal" or natural products of this type within [facility].   ACTION TAKEN: The pharmacy department is unable to verify this order at this time and your patient has been informed of this safety policy. Please reevaluate patient's clinical condition at discharge and address if the herbal or natural product(s) should be resumed at that time.   Dia Sitter, PharmD, BCPS

## 2014-04-03 NOTE — Interval H&P Note (Signed)
History and Physical Interval Note:  04/03/2014 7:26 AM  Derrick Edwards  has presented today for surgery, with the diagnosis of LEFT KNEE DEGENERATIVE JOINT DISEASE  The various methods of treatment have been discussed with the patient and family. After consideration of risks, benefits and other options for treatment, the patient has consented to  Procedure(s): TOTAL KNEE ARTHROPLASTY (Left) as a surgical intervention .  The patient's history has been reviewed, patient examined, no change in status, stable for surgery.  I have reviewed the patient's chart and labs.  Questions were answered to the patient's satisfaction.     Lesette Frary G

## 2014-04-04 ENCOUNTER — Encounter (HOSPITAL_COMMUNITY): Payer: Self-pay | Admitting: Orthopaedic Surgery

## 2014-04-04 LAB — GLUCOSE, CAPILLARY
Glucose-Capillary: 131 mg/dL — ABNORMAL HIGH (ref 70–99)
Glucose-Capillary: 114 mg/dL — ABNORMAL HIGH (ref 70–99)
Glucose-Capillary: 157 mg/dL — ABNORMAL HIGH (ref 70–99)
Glucose-Capillary: 145 mg/dL — ABNORMAL HIGH (ref 70–99)

## 2014-04-04 LAB — CBC
HCT: 35 % — ABNORMAL LOW (ref 39.0–52.0)
Hemoglobin: 11.7 g/dL — ABNORMAL LOW (ref 13.0–17.0)
MCH: 30.1 pg (ref 26.0–34.0)
MCHC: 33.4 g/dL (ref 30.0–36.0)
MCV: 90 fL (ref 78.0–100.0)
Platelets: 183 10*3/uL (ref 150–400)
RBC: 3.89 MIL/uL — ABNORMAL LOW (ref 4.22–5.81)
RDW: 13.5 % (ref 11.5–15.5)
WBC: 11.2 10*3/uL — ABNORMAL HIGH (ref 4.0–10.5)

## 2014-04-04 LAB — BASIC METABOLIC PANEL WITH GFR
Anion gap: 9 (ref 5–15)
BUN: 17 mg/dL (ref 6–23)
CO2: 28 meq/L (ref 19–32)
Calcium: 9.4 mg/dL (ref 8.4–10.5)
Chloride: 105 meq/L (ref 96–112)
Creatinine, Ser: 0.86 mg/dL (ref 0.50–1.35)
GFR calc Af Amer: >90 mL/min
GFR calc non Af Amer: 87 mL/min — ABNORMAL LOW
Glucose, Bld: 139 mg/dL — ABNORMAL HIGH (ref 70–99)
Potassium: 4.7 meq/L (ref 3.7–5.3)
Sodium: 142 meq/L (ref 137–147)

## 2014-04-04 NOTE — Progress Notes (Signed)
Physical Therapy Treatment Patient Details Name: RILEY HALLUM MRN: [id number] DOB: [birth year] Today's Date: 04/04/2014    History of Present Illness Pt admitted for L TKA.    PT Comments    Pt moving well this am.  Will continue to follow.    Follow Up Recommendations  Home health PT;Supervision/Assistance - 24 hour     Equipment Recommendations  None recommended by PT    Recommendations for Other Services       Precautions / Restrictions Precautions Precautions: Fall Required Braces or Orthoses: Knee Immobilizer - Left Knee Immobilizer - Left: On when out of bed or walking Restrictions Weight Bearing Restrictions: Yes LLE Weight Bearing: Weight bearing as tolerated    Mobility  Bed Mobility Overal bed mobility: Needs Assistance Bed Mobility: Supine to Sit     Supine to sit: Supervision     General bed mobility comments: pt moves slowly, but no phsyical A needed when KI on L LE.    Transfers Overall transfer level: Needs assistance Equipment used: Rolling walker (2 wheeled) Transfers: Sit to/from Stand Sit to Stand: Min guard         General transfer comment: cues for UE use and positioning L LE prior to sitting.    Ambulation/Gait Ambulation/Gait assistance: Min guard Ambulation Distance (Feet): 350 Feet Assistive device: Rolling walker (2 wheeled) Gait Pattern/deviations: Step-to pattern;Step-through pattern;Decreased step length - right;Decreased stance time - left;Decreased stride length;Trunk flexed   Gait velocity interpretation: Below normal speed for age/gender General Gait Details: pt working on equalizing step lengths, though tends to have flexed posture watching his LEs.  cues for posture and technique.     Stairs Stairs: Yes Stairs assistance: Min guard Stair Management: One rail Left;Step to pattern;Sideways Number of Stairs: 4 General stair comments: cues for safe technique and sequencing.    Wheelchair Mobility    Modified  Rankin (Stroke Patients Only)       Balance Overall balance assessment: Needs assistance         Standing balance support: Single extremity supported Standing balance-Leahy Scale: Poor                      Cognition Arousal/Alertness: Awake/alert Behavior During Therapy: WFL for tasks assessed/performed Overall Cognitive Status: Within Functional Limits for tasks assessed                      Exercises Total Joint Exercises Ankle Circles/Pumps: AROM;10 reps;Both Quad Sets: AROM;Both;10 reps Long Arc Quad: AAROM;Left;10 reps Knee Flexion: AAROM;Left;10 reps Goniometric ROM: AAROM ~ 15 - 75    General Comments        Pertinent Vitals/Pain Pain Assessment: 0-10 Pain Score: 4  Pain Location: L knee Pain Descriptors / Indicators: Aching;Tightness Pain Intervention(s): Repositioned;RN gave pain meds during session    Home Living                      Prior Function            PT Goals (current goals can now be found in the care plan section) Acute Rehab PT Goals PT Goal Formulation: With patient Time For Goal Achievement: 04/10/14 Potential to Achieve Goals: Good Progress towards PT goals: Progressing toward goals    Frequency  7X/week    PT Plan Current plan remains appropriate    Co-evaluation             End of Session Equipment Utilized During Treatment: Gait  belt;Left knee immobilizer Activity Tolerance: Patient tolerated treatment well Patient left: in chair;with call bell/phone within reach     Time: 0915-0949 PT Time Calculation (min): 34 min  Charges:  $Gait Training: 8-22 mins $Therapeutic Exercise: 8-22 mins                    G CodesCatarina Hartshorn, [place] 04/04/2014, 10:06 AM

## 2014-04-04 NOTE — Progress Notes (Signed)
Physical Therapy Treatment Patient Details Name: Derrick Edwards MRN: [id number] DOB: 1946-03-17 Today's Date: 04/04/2014    History of Present Illness Pt admitted for L TKA.    PT Comments    Pt continues to improve overall mobility and demos good recall on there ex.  Feel pt is ready for D/C to home.  Will continue to follow while on acute.    Follow Up Recommendations  Home health PT;Supervision/Assistance - 24 hour     Equipment Recommendations  None recommended by PT    Recommendations for Other Services       Precautions / Restrictions Precautions Precautions: Fall Required Braces or Orthoses: Knee Immobilizer - Left Knee Immobilizer - Left: On when out of bed or walking Restrictions Weight Bearing Restrictions: Yes LLE Weight Bearing: Weight bearing as tolerated    Mobility  Bed Mobility Overal bed mobility: Needs Assistance Bed Mobility: Sit to Supine       Sit to supine: Min guard   General bed mobility comments: With increased time, pt able to bring Bil LE in to bed without A or KI.    Transfers Overall transfer level: Needs assistance Equipment used: Rolling walker (2 wheeled) Transfers: Sit to/from Stand Sit to Stand: Min guard         General transfer comment: cues for UE use and positioning L LE prior to sitting.    Ambulation/Gait Ambulation/Gait assistance: Min guard Ambulation Distance (Feet): 300 Feet Assistive device: Rolling walker (2 wheeled) Gait Pattern/deviations: Step-through pattern;Step-to pattern;Decreased step length - right;Decreased stance time - left;Decreased stride length;Trunk flexed   Gait velocity interpretation: Below normal speed for age/gender General Gait Details: Continuing to work on equalizing gait pattern and upright posture.     Stairs            Wheelchair Mobility    Modified Rankin (Stroke Patients Only)       Balance                                    Cognition  Arousal/Alertness: Awake/alert Behavior During Therapy: WFL for tasks assessed/performed Overall Cognitive Status: Within Functional Limits for tasks assessed                      Exercises Total Joint Exercises Ankle Circles/Pumps: AROM;10 reps;Both Quad Sets: AROM;Both;10 reps Long Arc Quad: AAROM;Left;10 reps Knee Flexion: AROM;Left;10 reps    General Comments        Pertinent Vitals/Pain Pain Assessment: 0-10 Pain Score: 3  Pain Location: L knee.   Pain Descriptors / Indicators: Aching Pain Intervention(s): Premedicated before session;Repositioned    Home Living                      Prior Function            PT Goals (current goals can now be found in the care plan section) Acute Rehab PT Goals PT Goal Formulation: With patient Time For Goal Achievement: 04/10/14 Potential to Achieve Goals: Good Progress towards PT goals: Progressing toward goals    Frequency  7X/week    PT Plan Current plan remains appropriate    Co-evaluation             End of Session Equipment Utilized During Treatment: Gait belt;Left knee immobilizer Activity Tolerance: Patient tolerated treatment well Patient left: in bed;with call bell/phone within reach;with family/visitor present  Time: 1324-4010 PT Time Calculation (min): 19 min  Charges:  $Gait Training: 8-22 mins                    G CodesCatarina Hartshorn, [place] 04/04/2014, 3:23 PM

## 2014-04-04 NOTE — Progress Notes (Signed)
Subjective: 1 Day Post-Op Procedure(s) (LRB): TOTAL KNEE ARTHROPLASTY (Left)  Activity level:  wbat Diet tolerance:  Eating well Voiding:  ok Patient reports pain as mild.    Objective: Vital signs in last 24 hours: Temp:  [97.4 F (36.3 C)-99 F (37.2 C)] 97.9 F (36.6 C) (08/26 0735) Pulse Rate:  [62-86] 62 (08/26 0735) Resp:  [12-26] 24 (08/26 0735) BP: (111-133)/(52-69) 112/56 mmHg (08/26 0735) SpO2:  [94 %-100 %] 99 % (08/26 0735)  Labs:  Recent Labs  04/04/14 0519  HGB 11.7*    Recent Labs  04/04/14 0519  WBC 11.2*  RBC 3.89*  HCT 35.0*  PLT 183    Recent Labs  04/04/14 0519  NA 142  K 4.7  CL 105  CO2 28  BUN 17  CREATININE 0.86  GLUCOSE 139*  CALCIUM 9.4   No results found for this basename: LABPT, INR,  in the last 72 hours  Physical Exam:  Neurologically intact ABD soft Neurovascular intact Sensation intact distally Intact pulses distally Dorsiflexion/Plantar flexion intact Incision: dressing C/D/I and no drainage No cellulitis present Compartment soft  Assessment/Plan:  1 Day Post-Op Procedure(s) (LRB): TOTAL KNEE ARTHROPLASTY (Left) Advance diet Up with therapy D/C IV fluids Plan for discharge tomorrow Discharge home with home health Dressing Change to Mepilex. Continue on ASA 325mg  BID x 2 weeks Follow up in office 2 weeks post op.     Jaydy Fitzhenry, Larwance Sachs 04/04/2014, 8:03 AM

## 2014-04-05 LAB — CBC
HCT: 36.5 % — ABNORMAL LOW (ref 39.0–52.0)
Hemoglobin: 12 g/dL — ABNORMAL LOW (ref 13.0–17.0)
MCH: 29.9 pg (ref 26.0–34.0)
MCHC: 32.9 g/dL (ref 30.0–36.0)
MCV: 91 fL (ref 78.0–100.0)
PLATELETS: 176 10*3/uL (ref 150–400)
RBC: 4.01 MIL/uL — AB (ref 4.22–5.81)
RDW: 13.4 % (ref 11.5–15.5)
WBC: 8.9 10*3/uL (ref 4.0–10.5)

## 2014-04-05 LAB — GLUCOSE, CAPILLARY
Glucose-Capillary: 150 mg/dL — ABNORMAL HIGH (ref 70–99)
Glucose-Capillary: 150 mg/dL — ABNORMAL HIGH (ref 70–99)

## 2014-04-05 MED ORDER — METHOCARBAMOL 500 MG PO TABS: 500.0000 mg | ORAL_TABLET | Freq: Four times a day (QID) | ORAL | Status: DC | PRN

## 2014-04-05 MED ORDER — HYDROCODONE-ACETAMINOPHEN 5-325 MG PO TABS: 1.0000 | ORAL_TABLET | ORAL | Status: DC | PRN

## 2014-04-05 MED ORDER — ASPIRIN 325 MG PO TABS: 325.0000 mg | ORAL_TABLET | Freq: Two times a day (BID) | ORAL | Status: DC

## 2014-04-05 NOTE — Discharge Summary (Signed)
Patient ID: Derrick Edwards MRN: [id number] DOB/AGE: December 07, 1945 68 y.o.  Admit date: 04/03/2014 Discharge date: 04/05/2014  Admission Diagnoses:  Principal Problem:   Left knee DJD Active Problems:   Diabetes   Discharge Diagnoses:  Same  Past Medical History  Diagnosis Date  . Diabetes   . Coronary atherosclerosis of unspecified type of vessel, native or graft     left main CAD, 10/2010-Dr. V and Dr. Cyndia Bent  . Mixed hyperlipidemia   . GERD (gastroesophageal reflux disease)   . Shoulder pain   . Low back pain     associated with degenerative disc disease   . Arthralgia     with Vytorin and myalgias   . History of tobacco use   . Hyperplastic colon polyp     2, 2008, repeat colonoscopy in 2018  . Type II diabetes mellitus   . History of kidney stones     Surgeries: Procedure(s): TOTAL KNEE ARTHROPLASTY on 04/03/2014   Consultants:    Discharged Condition: Improved  Hospital Course: Derrick Edwards is an 68 y.o. male who was admitted 04/03/2014 for operative treatment ofLeft knee DJD. Patient has severe unremitting pain that affects sleep, daily activities, and work/hobbies. After pre-op clearance the patient was taken to the operating room on 04/03/2014 and underwent  Procedure(s): TOTAL KNEE ARTHROPLASTY.    Patient was given perioperative antibiotics: Anti-infectives   Start     Dose/Rate Route Frequency Ordered Stop   04/03/14 1400  ceFAZolin (ANCEF) IVPB 2 g/50 mL premix     2 g 100 mL/hr over 30 Minutes Intravenous Every 6 hours 04/03/14 1244 04/03/14 2024   04/03/14 0600  ceFAZolin (ANCEF) IVPB 2 g/50 mL premix     2 g 100 mL/hr over 30 Minutes Intravenous On call to O.R. 04/02/14 1317 04/03/14 0741       Patient was given sequential compression devices, early ambulation, and chemoprophylaxis to prevent DVT.  Patient benefited maximally from hospital stay and there were no complications.    Recent vital signs: Patient Vitals for the past 24 hrs:  BP Temp  Temp src Pulse Resp SpO2  04/05/14 0959 143/68 mmHg - - 81 - -  04/05/14 0835 137/50 mmHg 97.5 F (36.4 C) Oral 92 - 96 %  04/05/14 0519 153/66 mmHg 98.1 F (36.7 C) Oral 68 - 93 %  04/04/14 2008 146/62 mmHg 98.1 F (36.7 C) Oral 77 - 99 %  04/04/14 1415 140/60 mmHg 98.1 F (36.7 C) - 70 20 98 %     Recent laboratory studies:  Recent Labs  04/04/14 0519 04/05/14 0635  WBC 11.2* 8.9  HGB 11.7* 12.0*  HCT 35.0* 36.5*  PLT 183 176  NA 142  --   K 4.7  --   CL 105  --   CO2 28  --   BUN 17  --   CREATININE 0.86  --   GLUCOSE 139*  --   CALCIUM 9.4  --      Discharge Medications:     Medication List         aspirin 325 MG tablet  Take 1 tablet (325 mg total) by mouth 2 (two) times daily after a meal.     cholecalciferol 1000 UNITS tablet  Commonly known as:  VITAMIN D  Take 1,000 Units by mouth daily.     CoQ10 100 MG Caps  Take 100 mg by mouth daily.     Fish Oil 1000 MG Caps  Take 1,000 mg by  mouth daily.     HYDROcodone-acetaminophen 5-325 MG per tablet  Commonly known as:  NORCO/VICODIN  Take 1-2 tablets by mouth every 4 (four) hours as needed (breakthrough pain).     lisinopril 2.5 MG tablet  Commonly known as:  PRINIVIL,ZESTRIL  Take 2.5 mg by mouth daily.     metFORMIN 500 MG 24 hr tablet  Commonly known as:  GLUCOPHAGE-XR  Take 500 mg by mouth 2 (two) times daily.     methocarbamol 500 MG tablet  Commonly known as:  ROBAXIN  Take 1 tablet (500 mg total) by mouth every 6 (six) hours as needed for muscle spasms.     metoprolol tartrate 25 MG tablet  Commonly known as:  LOPRESSOR  Take 25 mg by mouth 2 (two) times daily.     nitroGLYCERIN 0.4 MG SL tablet  Commonly known as:  NITROSTAT  Place 0.4 mg under the tongue every 5 (five) minutes as needed for chest pain.     NON FORMULARY  Take 1 capsule by mouth 3 (three) times daily after meals. GLUCOSE SUPPORT to help with Diabetes  1 capsule after meals  Three times a day     PRILOSEC 20 MG  capsule  Generic drug:  omeprazole  Take 20 mg by mouth daily. 1 capsule     rosuvastatin 5 MG tablet  Commonly known as:  CRESTOR  Take 5 mg by mouth daily.     saxagliptin HCl 2.5 MG Tabs tablet  Commonly known as:  ONGLYZA  Take 5 mg by mouth daily.     tamsulosin 0.4 MG Caps capsule  Commonly known as:  FLOMAX  Take 0.4 mg by mouth daily after supper.        Diagnostic Studies: Dg Chest 2 View  03/26/2014   CLINICAL DATA:  Preop for left knee arthroplasty  EXAM: CHEST  2 VIEW  COMPARISON:  11/11/2010  FINDINGS: Cardiomediastinal silhouette is stable. Status post median sternotomy. No acute infiltrate or pleural effusion. No pulmonary edema. Bony thorax is unremarkable pre  IMPRESSION: No active cardiopulmonary disease.   Electronically Signed   By: Lahoma Crocker M.D.   On: 03/26/2014 09:45    Disposition: 01-Home or Self Care      Discharge Instructions   Call MD / Call 911    Complete by:  As directed   If you experience chest pain or shortness of breath, CALL 911 and be transported to the hospital emergency room.  If you develope a fever above 101 F, pus (white drainage) or increased drainage or redness at the wound, or calf pain, call your surgeon's office.     Constipation Prevention    Complete by:  As directed   Drink plenty of fluids.  Prune juice may be helpful.  You may use a stool softener, such as Colace (over the counter) 100 mg twice a day.  Use MiraLax (over the counter) for constipation as needed.     Diet - low sodium heart healthy    Complete by:  As directed      Increase activity slowly as tolerated    Complete by:  As directed            Follow-up Information   Follow up with Hessie Dibble, MD. Call in 2 weeks.   Specialty:  Orthopedic Surgery   Contact information:   [place]  [address] [phone]        Signed: Rich Fuchs 04/05/2014, 11:36 AM

## 2014-04-05 NOTE — Progress Notes (Signed)
Discharge teachings given; scripts given; L knee dressing changed to sterile Mepliex dressing; IV d/c'ed; pt discharged via wheelchair

## 2014-04-05 NOTE — Progress Notes (Signed)
Occupational Therapy Treatment and Discharge Patient Details Name: Derrick Edwards MRN: [id number] DOB: 1946-07-28 Today's Date: 04/05/2014    History of present illness Pt admitted for L TKA.   OT comments  This 68 yo male has completed all OT education and pt/wife do not have any further questions. Acute OT will sign off.  Follow Up Recommendations  No OT follow up;Supervision - Intermittent    Equipment Recommendations  None recommended by OT       Precautions / Restrictions Precautions Precautions: Fall Required Braces or Orthoses: Knee Immobilizer - Left Knee Immobilizer - Left: On when out of bed or walking Restrictions Weight Bearing Restrictions: No LLE Weight Bearing: Weight bearing as tolerated       Mobility Transfers    Min guard A sit<>stand and ambulate to toliet                      ADL Overall ADL's : Needs assistance/impaired                                       General ADL Comments: Took pt and wife to gym to go over tub transfer options with them once pt is cleared by MD to get incision wet. I demonstrated to he and his wife how he could use the 3n1 or a tub seat with back (they think they may have one). I asked pt if he felt the need to practice and he politely declined. I gave pt a handout on the steps how how to get in a tub using the 3n1. I demonstrated to wife how to don pt's KI and she verbalized understanding                Cognition   Behavior During Therapy: WFL for tasks assessed/performed Overall Cognitive Status: Within Functional Limits for tasks assessed                                    Pertinent Vitals/ Pain       Pain Assessment: No/denies pain         Frequency Min 2X/week     Progress Toward Goals  OT Goals(current goals can now be found in the care plan section)  Progress towards OT goals:  (All education completed)     Plan Discharge plan remains appropriate       End of  Session Equipment Utilized During Treatment: Left knee immobilizer   Activity Tolerance Patient tolerated treatment well   Patient Left  (in bathroom with family)           Time: 5631-4970 OT Time Calculation (min): 26 min  Charges: OT General Charges $OT Visit: 1 Procedure OT Treatments $Self Care/Home Management : 23-37 mins  Almon Register [phone] 04/05/2014, 1:36 PM

## 2014-04-05 NOTE — Progress Notes (Signed)
Physical Therapy Treatment Patient Details Name: KAMSIYOCHUKWU SPICKLER MRN: [id number] DOB: 02-10-1946 Today's Date: 04/05/2014    History of Present Illness Pt admitted for L TKA.    PT Comments    Pt moving very well and ready for D/C from PT stand point.    Follow Up Recommendations  Home health PT;Supervision/Assistance - 24 hour     Equipment Recommendations  None recommended by PT    Recommendations for Other Services       Precautions / Restrictions Precautions Precautions: Fall Required Braces or Orthoses: Knee Immobilizer - Left Knee Immobilizer - Left: On when out of bed or walking Restrictions Weight Bearing Restrictions: Yes LLE Weight Bearing: Weight bearing as tolerated    Mobility  Bed Mobility Overal bed mobility: Needs Assistance Bed Mobility: Supine to Sit     Supine to sit: Supervision     General bed mobility comments: pt able to perform without physical A with KI on.    Transfers Overall transfer level: Needs assistance Equipment used: Rolling walker (2 wheeled) Transfers: Sit to/from Stand Sit to Stand: Supervision         General transfer comment: Demos good technique.    Ambulation/Gait Ambulation/Gait assistance: Supervision Ambulation Distance (Feet): 350 Feet Assistive device: Rolling walker (2 wheeled) Gait Pattern/deviations: Step-to pattern;Decreased step length - right;Decreased stance time - left;Decreased stride length   Gait velocity interpretation: Below normal speed for age/gender General Gait Details: pt with increased pain during mobility today limiting ability to perform step-through gait pattern.     Stairs Stairs: Yes Stairs assistance: Min guard Stair Management: One rail Left;Step to pattern;Sideways Number of Stairs: 4 General stair comments: cues for safe technique and sequencing.    Wheelchair Mobility    Modified Rankin (Stroke Patients Only)       Balance Overall balance assessment: Needs  assistance         Standing balance support: Single extremity supported Standing balance-Leahy Scale: Poor                      Cognition Arousal/Alertness: Awake/alert Behavior During Therapy: WFL for tasks assessed/performed Overall Cognitive Status: Within Functional Limits for tasks assessed                      Exercises Total Joint Exercises Long Arc Quad: AAROM;Left;10 reps Knee Flexion: AROM;Left;10 reps Goniometric ROM: AAROM ~15 - 70    General Comments        Pertinent Vitals/Pain Pain Assessment: 0-10 Pain Score: 5  Pain Location: L knee. Pain Descriptors / Indicators: Aching;Pressure Pain Intervention(s): Premedicated before session;Repositioned    Home Living                      Prior Function            PT Goals (current goals can now be found in the care plan section) Acute Rehab PT Goals PT Goal Formulation: With patient Time For Goal Achievement: 04/10/14 Potential to Achieve Goals: Good Progress towards PT goals: Progressing toward goals    Frequency  7X/week    PT Plan Current plan remains appropriate    Co-evaluation             End of Session Equipment Utilized During Treatment: Gait belt;Left knee immobilizer Activity Tolerance: Patient tolerated treatment well Patient left: in chair;with call bell/phone within reach     Time: 8101-7510 PT Time Calculation (min): 28 min  Charges:  $  Gait Training: 23-37 mins                    G CodesCatarina Hartshorn, [place] 04/05/2014, 8:21 AM

## 2014-04-14 ENCOUNTER — Encounter (HOSPITAL_COMMUNITY): Payer: Self-pay | Admitting: Emergency Medicine

## 2014-04-14 ENCOUNTER — Emergency Department (HOSPITAL_COMMUNITY): Payer: Medicare HMO

## 2014-04-14 ENCOUNTER — Emergency Department (HOSPITAL_COMMUNITY)
Admission: EM | Admit: 2014-04-14 | Discharge: 2014-04-14 | Disposition: A | Discharge status: Home / Self Care | Payer: Medicare HMO | Attending: Emergency Medicine | Admitting: Emergency Medicine

## 2014-04-14 DIAGNOSIS — M2550 Pain in unspecified joint: Secondary | ICD-10-CM | POA: Insufficient documentation

## 2014-04-14 DIAGNOSIS — Z79899 Other long term (current) drug therapy: Secondary | ICD-10-CM | POA: Diagnosis not present

## 2014-04-14 DIAGNOSIS — Z9889 Other specified postprocedural states: Secondary | ICD-10-CM | POA: Diagnosis not present

## 2014-04-14 DIAGNOSIS — R52 Pain, unspecified: Secondary | ICD-10-CM | POA: Insufficient documentation

## 2014-04-14 DIAGNOSIS — E119 Type 2 diabetes mellitus without complications: Secondary | ICD-10-CM | POA: Insufficient documentation

## 2014-04-14 DIAGNOSIS — Z951 Presence of aortocoronary bypass graft: Secondary | ICD-10-CM | POA: Diagnosis not present

## 2014-04-14 DIAGNOSIS — Z87442 Personal history of urinary calculi: Secondary | ICD-10-CM | POA: Diagnosis not present

## 2014-04-14 DIAGNOSIS — Z860100 Personal history of colon polyps, unspecified: Secondary | ICD-10-CM | POA: Insufficient documentation

## 2014-04-14 DIAGNOSIS — K219 Gastro-esophageal reflux disease without esophagitis: Secondary | ICD-10-CM | POA: Insufficient documentation

## 2014-04-14 DIAGNOSIS — I2510 Atherosclerotic heart disease of native coronary artery without angina pectoris: Secondary | ICD-10-CM | POA: Insufficient documentation

## 2014-04-14 DIAGNOSIS — Z87891 Personal history of nicotine dependence: Secondary | ICD-10-CM | POA: Insufficient documentation

## 2014-04-14 DIAGNOSIS — R109 Unspecified abdominal pain: Secondary | ICD-10-CM

## 2014-04-14 DIAGNOSIS — E782 Mixed hyperlipidemia: Secondary | ICD-10-CM | POA: Insufficient documentation

## 2014-04-14 DIAGNOSIS — Z7982 Long term (current) use of aspirin: Secondary | ICD-10-CM | POA: Insufficient documentation

## 2014-04-14 LAB — URINALYSIS, ROUTINE W REFLEX MICROSCOPIC
BILIRUBIN URINE: NEGATIVE
Glucose, UA: NEGATIVE mg/dL
Hgb urine dipstick: NEGATIVE
Ketones, ur: 15 mg/dL — AB
Leukocytes, UA: NEGATIVE
NITRITE: NEGATIVE
Protein, ur: NEGATIVE mg/dL
SPECIFIC GRAVITY, URINE: 1.028 (ref 1.005–1.030)
UROBILINOGEN UA: 1 mg/dL (ref 0.0–1.0)
pH: 5.5 (ref 5.0–8.0)

## 2014-04-14 LAB — CBC WITH DIFFERENTIAL/PLATELET
Basophils Absolute: 0.1 10*3/uL (ref 0.0–0.1)
Basophils Relative: 1 % (ref 0–1)
EOS ABS: 0.3 10*3/uL (ref 0.0–0.7)
EOS PCT: 3 % (ref 0–5)
HCT: 38.5 % — ABNORMAL LOW (ref 39.0–52.0)
HEMOGLOBIN: 13.1 g/dL (ref 13.0–17.0)
LYMPHS ABS: 1.6 10*3/uL (ref 0.7–4.0)
Lymphocytes Relative: 18 % (ref 12–46)
MCH: 30.2 pg (ref 26.0–34.0)
MCHC: 34 g/dL (ref 30.0–36.0)
MCV: 88.7 fL (ref 78.0–100.0)
MONOS PCT: 10 % (ref 3–12)
Monocytes Absolute: 0.9 10*3/uL (ref 0.1–1.0)
Neutro Abs: 5.8 10*3/uL (ref 1.7–7.7)
Neutrophils Relative %: 68 % (ref 43–77)
Platelets: 321 10*3/uL (ref 150–400)
RBC: 4.34 MIL/uL (ref 4.22–5.81)
RDW: 13.6 % (ref 11.5–15.5)
WBC: 8.5 10*3/uL (ref 4.0–10.5)

## 2014-04-14 LAB — COMPREHENSIVE METABOLIC PANEL
ALK PHOS: 68 U/L (ref 39–117)
ALT: 27 U/L (ref 0–53)
ANION GAP: 13 (ref 5–15)
AST: 21 U/L (ref 0–37)
Albumin: 4.2 g/dL (ref 3.5–5.2)
BUN: 24 mg/dL — AB (ref 6–23)
CALCIUM: 10 mg/dL (ref 8.4–10.5)
CO2: 25 mEq/L (ref 19–32)
Chloride: 100 mEq/L (ref 96–112)
Creatinine, Ser: 0.98 mg/dL (ref 0.50–1.35)
GFR calc Af Amer: >90 mL/min (ref >90)
GFR calc non Af Amer: 83 mL/min — ABNORMAL LOW (ref >90)
Glucose, Bld: 130 mg/dL — ABNORMAL HIGH (ref 70–99)
Potassium: 5.1 mEq/L (ref 3.7–5.3)
Sodium: 138 mEq/L (ref 137–147)
TOTAL PROTEIN: 7.3 g/dL (ref 6.0–8.3)
Total Bilirubin: 0.7 mg/dL (ref 0.3–1.2)

## 2014-04-14 MED ORDER — OXYCODONE-ACETAMINOPHEN 5-325 MG PO TABS: 2.0000 | ORAL_TABLET | Freq: Four times a day (QID) | ORAL | Status: DC | PRN

## 2014-04-14 MED ORDER — OXYCODONE-ACETAMINOPHEN 5-325 MG PO TABS
2.0000 | ORAL_TABLET | Freq: Once | ORAL | Status: AC
  Administered 2014-04-14: 2 via ORAL
  Filled 2014-04-14: qty 2

## 2014-04-14 NOTE — ED Provider Notes (Signed)
CSN: [id number]     Arrival date & time 04/14/14  1718 History   First MD Initiated Contact with Patient 04/14/14 2015     Chief Complaint  Patient presents with  . Flank Pain     (Consider location/radiation/quality/duration/timing/severity/associated sxs/prior Treatment) HPI 68 year old male history of kidney stones years ago presents to 3 days intermittent colicky left flank pain without radiation or associated symptoms; pain lasts for several minutes to a few hours at a time without radiation without chest pain without cough without shortness breath abdominal pain without scrotal pain without leg pain without weakness without numbness without change in bowel or bladder function but this pain feels like his prior kidney stones mild to moderate in severity at times.    Past Medical History  Diagnosis Date  . Diabetes   . Coronary atherosclerosis of unspecified type of vessel, native or graft     left main CAD, 10/2010-Dr. V and Dr. Cyndia Bent  . Mixed hyperlipidemia   . GERD (gastroesophageal reflux disease)   . Shoulder pain   . Low back pain     associated with degenerative disc disease   . Arthralgia     with Vytorin and myalgias   . History of tobacco use   . Hyperplastic colon polyp     2, 2008, repeat colonoscopy in 2018  . Type II diabetes mellitus   . History of kidney stones    Past Surgical History  Procedure Laterality Date  . Kidney stone surgery  x 3  . Coronary artery bypass graft  2012  . Cardiac catheterization  2012  . Back surgery      fusion for fracture verterbre   . Total knee arthroplasty Left 04/03/2014    DR DALLDORF  . Total knee arthroplasty Left 04/03/2014    Procedure: TOTAL KNEE ARTHROPLASTY;  Surgeon: Hessie Dibble, MD;  Location: [facility];  Service: Orthopedics;  Laterality: Left;   Family History  Problem Relation Age of Onset  . Heart disease Father     and stroke   History  Substance Use Topics  . Smoking status: Former Smoker -- 1.00  packs/day for 20 years    Types: Cigarettes    Quit date: 08/11/1983  . Smokeless tobacco: Never Used  . Alcohol Use: No    Review of Systems  10 Systems reviewed and are negative for acute change except as noted in the HPI.  Allergies  Ezetimibe-simvastatin  Home Medications   Prior to Admission medications   Medication Sig Start Date End Date Taking? Authorizing Provider  aspirin 325 MG tablet Take 1 tablet (325 mg total) by mouth 2 (two) times daily after a meal. 04/05/14  Yes Larwance Sachs Nida, PA-C  cholecalciferol (VITAMIN D) 1000 UNITS tablet Take 1,000 Units by mouth daily.   Yes Historical Provider, MD  Coenzyme Q10 (COQ10) 100 MG CAPS Take 100 mg by mouth daily.   Yes Historical Provider, MD  HYDROcodone-acetaminophen (NORCO/VICODIN) 5-325 MG per tablet Take 1-2 tablets by mouth every 4 (four) hours as needed (breakthrough pain). 04/05/14  Yes Larwance Sachs Nida, PA-C  lisinopril (PRINIVIL,ZESTRIL) 2.5 MG tablet Take 2.5 mg by mouth daily.   Yes Historical Provider, MD  metFORMIN (GLUCOPHAGE-XR) 500 MG 24 hr tablet Take 500 mg by mouth 2 (two) times daily.   Yes Historical Provider, MD  methocarbamol (ROBAXIN) 500 MG tablet Take 1 tablet (500 mg total) by mouth every 6 (six) hours as needed for muscle spasms. 04/05/14  Yes Larwance Sachs  Nida, PA-C  metoprolol tartrate (LOPRESSOR) 25 MG tablet Take 25 mg by mouth 2 (two) times daily.   Yes Historical Provider, MD  Omega-3 Fatty Acids (FISH OIL) 1000 MG CAPS Take 1,000 mg by mouth daily.   Yes Historical Provider, MD  omeprazole (PRILOSEC) 20 MG capsule Take 20 mg by mouth daily. 1 capsule   Yes Historical Provider, MD  rosuvastatin (CRESTOR) 5 MG tablet Take 5 mg by mouth daily at 6 PM.    Yes Historical Provider, MD  saxagliptin HCl (ONGLYZA) 2.5 MG TABS tablet Take 5 mg by mouth daily.   Yes Historical Provider, MD  tamsulosin (FLOMAX) 0.4 MG CAPS capsule Take 0.4 mg by mouth daily after supper.   Yes Historical Provider, MD   nitroGLYCERIN (NITROSTAT) 0.4 MG SL tablet Place 0.4 mg under the tongue every 5 (five) minutes as needed for chest pain.    Historical Provider, MD  oxyCODONE-acetaminophen (PERCOCET) 5-325 MG per tablet Take 2 tablets by mouth every 6 (six) hours as needed for severe pain. 04/14/14   Babette Relic, MD   BP 126/66  Pulse 76  Temp(Src) 98.1 F (36.7 C) (Oral)  Resp 24  SpO2 95% Physical Exam  Nursing note and vitals reviewed. Constitutional:  Awake, alert, nontoxic appearance.  HENT:  Head: Atraumatic.  Eyes: Right eye exhibits no discharge. Left eye exhibits no discharge.  Neck: Neck supple.  Cardiovascular: Normal rate and regular rhythm.   No murmur heard. Pulmonary/Chest: Effort normal and breath sounds normal. No respiratory distress. He has no wheezes. He has no rales. He exhibits no tenderness.  Abdominal: Soft. There is no tenderness. There is no rebound.  Genitourinary:  No scrotal tenderness  Musculoskeletal: He exhibits tenderness.  Baseline ROM, no obvious new focal weakness. Minimal left flank and CVA tenderness on the left no midline back tenderness no pelvis tenderness no tenderness to his legs  Neurological: He is alert.  Mental status and motor strength appears baseline for patient and situation.  Skin: No rash noted.  Psychiatric: He has a normal mood and affect.    ED Course  Procedures (including critical care time) Pt stable in ED with no significant deterioration in condition.Patient / Family / Caregiver informed of clinical course, understand medical decision-making process, and agree with plan. Uncertain etiology of patient's left flank pain it does not sound like radiculopathy. Labs Review Labs Reviewed  CBC WITH DIFFERENTIAL - Abnormal; Notable for the following:    HCT 38.5 (*)    All other components within normal limits  COMPREHENSIVE METABOLIC PANEL - Abnormal; Notable for the following:    Glucose, Bld 130 (*)    BUN 24 (*)    GFR calc non Af  Amer 83 (*)    All other components within normal limits  URINALYSIS, ROUTINE W REFLEX MICROSCOPIC - Abnormal; Notable for the following:    Color, Urine AMBER (*)    APPearance CLOUDY (*)    Ketones, ur 15 (*)    All other components within normal limits    Imaging Review Ct Renal Stone Study  04/14/2014   CLINICAL DATA:  Left flank pain with difficulty urinating.  EXAM: CT RENAL STONE PROTOCOL  TECHNIQUE: Multidetector CT imaging of the abdomen and pelvis was performed following the standard protocol without intravenous contrast  COMPARISON:  None.  FINDINGS: Lung bases are clear.  Poorly defined calcifications demonstrated in the region of the medullary pyramids bilaterally probably representing medullary sponge kidney. No hydronephrosis or hydroureter on either  side. No ureteral stones. No bladder stones or significant bladder wall thickening appreciated. There is evidence of a the rectal remnant causing tenting of the dome of the bladder. There is fat in the anterior bladder wall.  The unenhanced appearance of the liver, spleen, gallbladder, pancreas, adrenal glands, abdominal aorta, inferior vena cava, and retroperitoneal lymph nodes is unremarkable. Stomach and small bowel are decompressed. Stool-filled colon without distention. No free air or free fluid in the abdomen.  Pelvis: The appendix is normal. Diverticulosis of the sigmoid colon without evidence of diverticulitis. The prostate gland is mildly enlarged that 5.1 x 3.1 cm. No free or loculated pelvic fluid collections. No pelvic mass or lymphadenopathy. Postoperative changes with posterior fixation of L5-S1. No destructive bone lesions.  IMPRESSION: Scattered calcifications in the region of the medullary pyramids of both kidneys suggesting medullary sponge kidney. No obstructing stone is demonstrated. Incidental note of prominent urachal remnant in the bladder.   Electronically Signed   By: Lucienne Capers M.D.   On: 04/14/2014 21:54      EKG Interpretation None      MDM   Final diagnoses:  Acute left flank pain    I doubt any other EMC precluding discharge at this time including, but not necessarily limited to the following:AAA, UTI, radiculopathy.    Babette Relic, MD 04/15/14 [phone]

## 2014-04-14 NOTE — ED Notes (Signed)
The pt is c/o pain in his lt flank for 3-4 days.  No blood in urine  No frequency.  He was sen here from [facility].  Urine and an xray was done there

## 2014-04-14 NOTE — Discharge Instructions (Signed)
SEEK IMMEDIATE MEDICAL ATTENTION IF: New numbness, tingling, weakness, or problem with the use of your arms or legs.  Severe back pain not relieved with medications.  Change in bowel or bladder control.  Increasing pain in any areas of the body (such as chest or abdominal pain).  Shortness of breath, dizziness or fainting.  Nausea (feeling sick to your stomach), vomiting, fever, or sweats.  

## 2015-06-06 ENCOUNTER — Encounter: Payer: Self-pay | Admitting: Interventional Cardiology

## 2015-06-06 ENCOUNTER — Ambulatory Visit (INDEPENDENT_AMBULATORY_CARE_PROVIDER_SITE_OTHER): Payer: Medicare Other | Admitting: Interventional Cardiology

## 2015-06-06 VITALS — BP 110/58 | HR 54 | Ht 68.0 in | Wt 168.0 lb

## 2015-06-06 DIAGNOSIS — E782 Mixed hyperlipidemia: Secondary | ICD-10-CM | POA: Diagnosis not present

## 2015-06-06 DIAGNOSIS — I2510 Atherosclerotic heart disease of native coronary artery without angina pectoris: Secondary | ICD-10-CM

## 2015-06-06 DIAGNOSIS — E1159 Type 2 diabetes mellitus with other circulatory complications: Secondary | ICD-10-CM | POA: Diagnosis not present

## 2015-06-06 MED ORDER — ASPIRIN 81 MG PO TABS: 81.0000 mg | ORAL_TABLET | Freq: Two times a day (BID) | ORAL | Status: DC

## 2015-06-06 NOTE — Patient Instructions (Signed)
Medication Instructions:  Decrease Aspirin to 81 mg daily-All other medications remain the same.  Labwork: None  Testing/Procedures: None  Follow-Up: Your physician wants you to follow-up in: 1 year. You will receive a reminder letter in the mail two months in advance. If you don't receive a letter, please call our office to schedule the follow-up appointment.      If you need a refill on your cardiac medications before your next appointment, please call your pharmacy.

## 2015-06-06 NOTE — Progress Notes (Signed)
Patient ID: Derrick Edwards, male   DOB: 1945-10-27, 69 y.o.   MRN: [id number]     Cardiology Office Note   Date:  06/06/2015   ID:  Derrick Edwards, DOB Apr 23, 1946, MRN [id number]  PCP:  Henrine Screws, MD    No chief complaint on file. f/u CAD   Wt Readings from Last 3 Encounters:  06/06/15 168 lb (76.204 kg)  04/03/14 163 lb (73.936 kg)  03/26/14 163 lb 12.8 oz (74.299 kg)       History of Present Illness: Derrick Edwards is a 69 y.o. male  who had CABG in 2012. He walked 30 minutes 5 days a week. His knees are bothering him. He had left knee replacement in 8/15. CAD/ASCVD:   Denies : Chest pain.  Dizziness.  Dyspnea on exertion.  Leg edema.  Nitroglycerin.  Orthopnea.  Palpitations.  Syncope.   He walks up a hill at home without CP, SHOB.  Regular walking limited by back pain.  Not trying to get to 150 minutes per week of exercise these days.    Past Medical History  Diagnosis Date  . Diabetes ([place])   . Coronary atherosclerosis of unspecified type of vessel, native or graft     left main CAD, 10/2010-Dr. V and Dr. Cyndia Bent  . Mixed hyperlipidemia   . GERD (gastroesophageal reflux disease)   . Shoulder pain   . Low back pain     associated with degenerative disc disease   . Arthralgia     with Vytorin and myalgias   . History of tobacco use   . Hyperplastic colon polyp     2, 2008, repeat colonoscopy in 2018  . Type II diabetes mellitus ([place])   . History of kidney stones     Past Surgical History  Procedure Laterality Date  . Kidney stone surgery  x 3  . Coronary artery bypass graft  2012  . Cardiac catheterization  2012  . Back surgery      fusion for fracture verterbre   . Total knee arthroplasty Left 04/03/2014    DR DALLDORF  . Total knee arthroplasty Left 04/03/2014    Procedure: TOTAL KNEE ARTHROPLASTY;  Surgeon: Hessie Dibble, MD;  Location: [facility];  Service: Orthopedics;  Laterality: Left;     Current Outpatient Prescriptions  Medication  Sig Dispense Refill  . aspirin 325 MG tablet Take 1 tablet (325 mg total) by mouth 2 (two) times daily after a meal. 30 tablet 0  . cholecalciferol (VITAMIN D) 1000 UNITS tablet Take 1,000 Units by mouth daily.    . Coenzyme Q10 (COQ10) 100 MG CAPS Take 100 mg by mouth daily.    Marland Kitchen JANUVIA 100 MG tablet Take 100 mg by mouth daily.     Marland Kitchen lisinopril (PRINIVIL,ZESTRIL) 2.5 MG tablet Take 2.5 mg by mouth daily.    . metFORMIN (GLUCOPHAGE-XR) 500 MG 24 hr tablet Take 500 mg by mouth 2 (two) times daily.    . metoprolol tartrate (LOPRESSOR) 25 MG tablet Take 25 mg by mouth 2 (two) times daily.    . nitroGLYCERIN (NITROSTAT) 0.4 MG SL tablet Place 0.4 mg under the tongue every 5 (five) minutes as needed for chest pain.    . Omega-3 Fatty Acids (FISH OIL) 1000 MG CAPS Take 1,000 mg by mouth daily.    Marland Kitchen omeprazole (PRILOSEC) 20 MG capsule Take 20 mg by mouth daily. 1 capsule    . rosuvastatin (CRESTOR) 5 MG tablet Take 5 mg by  mouth daily at 6 PM.     . saxagliptin HCl (ONGLYZA) 2.5 MG TABS tablet Take 5 mg by mouth daily.    . tamsulosin (FLOMAX) 0.4 MG CAPS capsule Take 0.4 mg by mouth daily after supper.     No current facility-administered medications for this visit.    Allergies:   Ezetimibe-simvastatin    Social History:  The patient  reports that he quit smoking about 31 years ago. His smoking use included Cigarettes. He has a 20 pack-year smoking history. He has never used smokeless tobacco. He reports that he does not drink alcohol or use illicit drugs.   Family History:  The patient's family history includes Heart attack in his brother; Heart disease in his father; Stroke in his father. There is no history of Hypertension.    ROS:  Please see the history of present illness.   Otherwise, review of systems are positive for joint pains.   All other systems are reviewed and negative.    PHYSICAL EXAM: VS:  BP 110/58 mmHg  Pulse 54  Ht 5\' 8"  (1.727 m)  Wt 168 lb (76.204 kg)  BMI 25.55  kg/m2 , BMI Body mass index is 25.55 kg/(m^2). GEN: Well nourished, well developed, in no acute distress HEENT: normal Neck: no JVD, carotid bruits, or masses Cardiac: RRR; 2/6 early systolic murmur, rubs, or gallops,no edema  Respiratory:  clear to auscultation bilaterally, normal work of breathing GI: soft, nontender, nondistended, + BS MS: no deformity or atrophy Skin: warm and dry, no rash Neuro:  Strength and sensation are intact Psych: euthymic mood, full affect   EKG:   The ekg ordered today demonstrates NSR, IRBBB   Recent Labs: No results found for requested labs within last 365 days.   Lipid Panel    Component Value Date/Time   CHOL  10/12/2010 0430    117        ATP III CLASSIFICATION:  <200     mg/dL   Desirable  200-239  mg/dL   Borderline High  >=240    mg/dL   High          TRIG 82 10/12/2010 0430   HDL 29* 10/12/2010 0430   CHOLHDL 4.0 10/12/2010 0430   VLDL 16 10/12/2010 0430   LDLCALC  10/12/2010 0430    72        Total Cholesterol/HDL:CHD Risk Coronary Heart Disease Risk Table                     Men   Women  1/2 Average Risk   3.4   3.3  Average Risk       5.0   4.4  2 X Average Risk   9.6   7.1  3 X Average Risk  23.4   11.0        Use the calculated Patient Ratio above and the CHD Risk Table to determine the patient's CHD Risk.        ATP III CLASSIFICATION (LDL):  <100     mg/dL   Optimal  100-129  mg/dL   Near or Above                    Optimal  130-159  mg/dL   Borderline  160-189  mg/dL   High  >190     mg/dL   Very High     Other studies Reviewed: Additional studies/ records that were reviewed today with results demonstrating: CABG in  2012; TKR in 2015.   ASSESSMENT AND PLAN:  1. CAD: Decrease aspirin to 81 mg daily.  Angina resolved. DOing well post bypass.  COntinue aggressive secondary prevention. No AAA in 3/12 by cath.  2. Hyperlipidemia: COntinue Crestor and fish oil.  Labs checked by Dr. Inda Merlin. 3. DM: Followed by  Dr. Laurann Montana.  We discussed at length the importance of increasing exercise.  He try to find some activity that does not irritate his back or other joints to get adequate exercise.   Current medicines are reviewed at length with the patient today.  The patient concerns regarding his medicines were addressed.  The following changes have been made:  No change  Labs/ tests ordered today include:  No orders of the defined types were placed in this encounter.    Recommend 150 minutes/week of aerobic exercise Low fat, low carb, high fiber diet recommended  Disposition:   FU in 1 year   Teresita Madura., MD  06/06/2015 9:34 AM    [facility] [address], [place], [place]  [address] Phone: [phone]; Fax: [phone]

## 2016-06-02 NOTE — Progress Notes (Signed)
Patient ID: Derrick Edwards, male   DOB: 05-Dec-1945, 70 y.o.   MRN: [id number]     Cardiology Office Note   Date:  06/03/2016   ID:  Derrick Edwards, DOB 1946/02/15, MRN [id number]  PCP:  Henrine Screws, MD    No chief complaint on file. f/u CAD   Wt Readings from Last 3 Encounters:  06/03/16 74.8 kg (165 lb)  06/06/15 76.2 kg (168 lb)  04/03/14 73.9 kg (163 lb)       History of Present Illness: Derrick Edwards is a 70 y.o. male  who had CABG in 2012. He walked 30 minutes 5 days a week. His knees are bothering him. He had left knee replacement in 8/15. CAD/ASCVD:   Denies : Chest pain. Dizziness. Dyspnea on exertion. Leg edema. Nitroglycerin. Orthopnea. Palpitations. Syncope.   He walks up a hill at home without CP, SHOB.  Regular walking limited by back pain.  He has not been walking as much of late- now with hip pain and he feels more lazy. Not trying to get to 150 minutes per week of exercise these days.      Past Medical History:  Diagnosis Date  . Arthralgia    with Vytorin and myalgias   . Coronary atherosclerosis of unspecified type of vessel, native or graft    left main CAD, 10/2010-Dr. V and Dr. Cyndia Bent  . Diabetes ([place])   . GERD (gastroesophageal reflux disease)   . History of kidney stones   . History of tobacco use   . Hyperplastic colon polyp    2, 2008, repeat colonoscopy in 2018  . Low back pain    associated with degenerative disc disease   . Mixed hyperlipidemia   . Shoulder pain   . Type II diabetes mellitus ([place])     Past Surgical History:  Procedure Laterality Date  . BACK SURGERY     fusion for fracture verterbre   . CARDIAC CATHETERIZATION  2012  . CORONARY ARTERY BYPASS GRAFT  2012  . KIDNEY STONE SURGERY  x 3  . TOTAL KNEE ARTHROPLASTY Left 04/03/2014   DR DALLDORF  . TOTAL KNEE ARTHROPLASTY Left 04/03/2014   Procedure: TOTAL KNEE ARTHROPLASTY;  Surgeon: Hessie Dibble, MD;  Location: [facility];  Service: Orthopedics;  Laterality: Left;       Current Outpatient Prescriptions  Medication Sig Dispense Refill  . aspirin 325 MG tablet Take 325 mg by mouth daily.    . cholecalciferol (VITAMIN D) 1000 UNITS tablet Take 1,000 Units by mouth daily.    . Coenzyme Q10 (COQ10) 100 MG CAPS Take 100 mg by mouth daily.    Marland Kitchen JANUVIA 100 MG tablet Take 100 mg by mouth daily.     Marland Kitchen lisinopril (PRINIVIL,ZESTRIL) 2.5 MG tablet Take 2.5 mg by mouth daily.    . metFORMIN (GLUCOPHAGE-XR) 500 MG 24 hr tablet Take 500 mg by mouth 2 (two) times daily.    . metoprolol tartrate (LOPRESSOR) 25 MG tablet Take 25 mg by mouth 2 (two) times daily.    . nitroGLYCERIN (NITROSTAT) 0.4 MG SL tablet Place 0.4 mg under the tongue every 5 (five) minutes as needed for chest pain.    . Omega-3 Fatty Acids (FISH OIL) 1000 MG CAPS Take 1,000 mg by mouth daily.    . ONE TOUCH ULTRA TEST test strip 1 each by Other route as needed for other.     . rosuvastatin (CRESTOR) 5 MG tablet Take 5 mg by mouth daily  at 6 PM.     . tamsulosin (FLOMAX) 0.4 MG CAPS capsule Take 0.4 mg by mouth daily after supper.     No current facility-administered medications for this visit.     Allergies:   Ezetimibe-simvastatin    Social History:  The patient  reports that he quit smoking about 32 years ago. His smoking use included Cigarettes. He has a 20.00 pack-year smoking history. He has never used smokeless tobacco. He reports that he does not drink alcohol or use drugs.   Family History:  The patient's family history includes Heart attack in his brother; Heart disease in his father; Stroke in his father.    ROS:  Please see the history of present illness.   Otherwise, review of systems are positive for joint pains; sleeps easily when he is in a chair.   All other systems are reviewed and negative.    PHYSICAL EXAM: VS:  BP (!) 112/58   Pulse (!) 57   Ht 5\' 8"  (1.727 m)   Wt 74.8 kg (165 lb)   BMI 25.09 kg/m  , BMI Body mass index is 25.09 kg/m. GEN: Well nourished, well  developed, in no acute distress  HEENT: normal  Neck: no JVD, carotid bruits, or masses Cardiac: RRR; 2/6 early systolic murmur, rubs, or gallops,no edema  Respiratory:  clear to auscultation bilaterally, normal work of breathing GI: soft, nontender, nondistended, + BS MS: no deformity or atrophy  Skin: warm and dry, no rash Neuro:  Strength and sensation are intact Psych: euthymic mood, full affect   EKG:   The ekg ordered today demonstrates NSR, IRBBB   Recent Labs: No results found for requested labs within last 8760 hours.   Lipid Panel    Component Value Date/Time   CHOL  10/12/2010 0430    117        ATP III CLASSIFICATION:  <200     mg/dL   Desirable  200-239  mg/dL   Borderline High  >=240    mg/dL   High          TRIG 82 10/12/2010 0430   HDL 29 (L) 10/12/2010 0430   CHOLHDL 4.0 10/12/2010 0430   VLDL 16 10/12/2010 0430   LDLCALC  10/12/2010 0430    72        Total Cholesterol/HDL:CHD Risk Coronary Heart Disease Risk Table                     Men   Women  1/2 Average Risk   3.4   3.3  Average Risk       5.0   4.4  2 X Average Risk   9.6   7.1  3 X Average Risk  23.4   11.0        Use the calculated Patient Ratio above and the CHD Risk Table to determine the patient's CHD Risk.        ATP III CLASSIFICATION (LDL):  <100     mg/dL   Optimal  100-129  mg/dL   Near or Above                    Optimal  130-159  mg/dL   Borderline  160-189  mg/dL   High  >190     mg/dL   Very High     Other studies Reviewed: Additional studies/ records that were reviewed today with results demonstrating: CABG in 2012; TKR in 2015.   ASSESSMENT  AND PLAN:  1. CAD: Decrease aspirin to 81 mg daily.  Angina resolved. DOing well post bypass.  COntinue aggressive secondary prevention. No AAA in 3/12 by cath. No significant disease in his carotids at the time of CABG.  2. Hyperlipidemia: COntinue Crestor and fish oil.  Labs checked by Dr. Inda Merlin. 3. DM: Followed by Dr.  Laurann Montana.  We discussed at length the importance of increasing exercise to control blood sugar.  He will try to find some activity that does not irritate his back or other joints to get adequate exercise. 4. Daytime sleepiness.  Reported by the wife.  No snoring or observed apnea.  He falls asleep easily when sitting in the chair.  If this persists, consider sleep study. We discussed sleep apnea.  5.    Current medicines are reviewed at length with the patient today.  The patient concerns regarding his medicines were addressed.  The following changes have been made:  No change  Labs/ tests ordered today include:  No orders of the defined types were placed in this encounter.   Recommend 150 minutes/week of aerobic exercise Low fat, low carb, high fiber diet recommended  Disposition:   FU in 1 year   Signed,  Larae Grooms, MD  06/03/2016 10:23 AM    [facility] [place], [place], [place]  [address] Phone: [phone]; Fax: [phone]

## 2016-06-03 ENCOUNTER — Encounter: Payer: Self-pay | Admitting: Interventional Cardiology

## 2016-06-03 ENCOUNTER — Ambulatory Visit (INDEPENDENT_AMBULATORY_CARE_PROVIDER_SITE_OTHER): Payer: Medicare Other | Admitting: Interventional Cardiology

## 2016-06-03 VITALS — BP 112/58 | HR 57 | Ht 68.0 in | Wt 165.0 lb

## 2016-06-03 DIAGNOSIS — E782 Mixed hyperlipidemia: Secondary | ICD-10-CM | POA: Diagnosis not present

## 2016-06-03 DIAGNOSIS — I2510 Atherosclerotic heart disease of native coronary artery without angina pectoris: Secondary | ICD-10-CM | POA: Diagnosis not present

## 2016-06-03 DIAGNOSIS — R400 Somnolence: Secondary | ICD-10-CM

## 2016-06-03 DIAGNOSIS — E1159 Type 2 diabetes mellitus with other circulatory complications: Secondary | ICD-10-CM | POA: Diagnosis not present

## 2016-06-03 NOTE — Patient Instructions (Signed)
**Note De-identified Derrick Edwards Obfuscation** Medication Instructions:  Same-no changes  Labwork: None  Testing/Procedures: None  Follow-Up: Your physician wants you to follow-up in: 1 year. You will receive a reminder letter in the mail two months in advance. If you don't receive a letter, please call our office to schedule the follow-up appointment.      If you need a refill on your cardiac medications before your next appointment, please call your pharmacy.   

## 2016-09-02 DIAGNOSIS — R69 Illness, unspecified: Secondary | ICD-10-CM | POA: Diagnosis not present

## 2016-09-30 DIAGNOSIS — Z7984 Long term (current) use of oral hypoglycemic drugs: Secondary | ICD-10-CM | POA: Diagnosis not present

## 2016-09-30 DIAGNOSIS — E1165 Type 2 diabetes mellitus with hyperglycemia: Secondary | ICD-10-CM | POA: Diagnosis not present

## 2016-09-30 DIAGNOSIS — E782 Mixed hyperlipidemia: Secondary | ICD-10-CM | POA: Diagnosis not present

## 2016-10-02 DIAGNOSIS — Z7984 Long term (current) use of oral hypoglycemic drugs: Secondary | ICD-10-CM | POA: Diagnosis not present

## 2016-10-02 DIAGNOSIS — E1165 Type 2 diabetes mellitus with hyperglycemia: Secondary | ICD-10-CM | POA: Diagnosis not present

## 2016-10-22 DIAGNOSIS — H40013 Open angle with borderline findings, low risk, bilateral: Secondary | ICD-10-CM | POA: Diagnosis not present

## 2016-10-22 DIAGNOSIS — H524 Presbyopia: Secondary | ICD-10-CM | POA: Diagnosis not present

## 2016-10-22 DIAGNOSIS — H5703 Miosis: Secondary | ICD-10-CM | POA: Diagnosis not present

## 2016-12-23 DIAGNOSIS — R69 Illness, unspecified: Secondary | ICD-10-CM | POA: Diagnosis not present

## 2017-01-08 DIAGNOSIS — K219 Gastro-esophageal reflux disease without esophagitis: Secondary | ICD-10-CM | POA: Diagnosis not present

## 2017-01-08 DIAGNOSIS — M545 Low back pain: Secondary | ICD-10-CM | POA: Diagnosis not present

## 2017-01-08 DIAGNOSIS — E1165 Type 2 diabetes mellitus with hyperglycemia: Secondary | ICD-10-CM | POA: Diagnosis not present

## 2017-01-08 DIAGNOSIS — E782 Mixed hyperlipidemia: Secondary | ICD-10-CM | POA: Diagnosis not present

## 2017-01-08 DIAGNOSIS — E559 Vitamin D deficiency, unspecified: Secondary | ICD-10-CM | POA: Diagnosis not present

## 2017-01-08 DIAGNOSIS — L309 Dermatitis, unspecified: Secondary | ICD-10-CM | POA: Diagnosis not present

## 2017-01-08 DIAGNOSIS — Z1389 Encounter for screening for other disorder: Secondary | ICD-10-CM | POA: Diagnosis not present

## 2017-01-08 DIAGNOSIS — I2510 Atherosclerotic heart disease of native coronary artery without angina pectoris: Secondary | ICD-10-CM | POA: Diagnosis not present

## 2017-01-08 DIAGNOSIS — N400 Enlarged prostate without lower urinary tract symptoms: Secondary | ICD-10-CM | POA: Diagnosis not present

## 2017-01-08 DIAGNOSIS — Z0001 Encounter for general adult medical examination with abnormal findings: Secondary | ICD-10-CM | POA: Diagnosis not present

## 2017-01-18 DIAGNOSIS — Z1211 Encounter for screening for malignant neoplasm of colon: Secondary | ICD-10-CM | POA: Diagnosis not present

## 2017-04-07 DIAGNOSIS — R69 Illness, unspecified: Secondary | ICD-10-CM | POA: Diagnosis not present

## 2017-05-03 DIAGNOSIS — H40013 Open angle with borderline findings, low risk, bilateral: Secondary | ICD-10-CM | POA: Diagnosis not present

## 2017-05-03 DIAGNOSIS — H35033 Hypertensive retinopathy, bilateral: Secondary | ICD-10-CM | POA: Diagnosis not present

## 2017-05-03 DIAGNOSIS — H40033 Anatomical narrow angle, bilateral: Secondary | ICD-10-CM | POA: Diagnosis not present

## 2017-05-03 DIAGNOSIS — H353132 Nonexudative age-related macular degeneration, bilateral, intermediate dry stage: Secondary | ICD-10-CM | POA: Diagnosis not present

## 2017-06-06 NOTE — Progress Notes (Signed)
Cardiology Office Note   Date:  06/07/2017   ID:  Talen, Poser Jul 08, 1946, MRN [id number]  PCP:  Josetta Huddle, MD    No chief complaint on file.  CAD  Wt Readings from Last 3 Encounters:  06/07/17 161 lb 6.4 oz (73.2 kg)  06/03/16 165 lb (74.8 kg)  06/06/15 168 lb (76.2 kg)       History of Present Illness: CORIN TILLY is a 71 y.o. male  who had CABG, 3 vessel,  in 2012.  He had left knee replacement in 8/15.  Denies : Chest pain. Dizziness. Leg edema. Nitroglycerin use. Orthopnea. Palpitations. Paroxysmal nocturnal dyspnea. Shortness of breath. Syncope.   Not walking much due to chronic foot pain.   He is eligible to use the [employer], but he has never gone.  He can have membership for free.  If he still has issues with falling asleep easily when he sits in a recliner.     Past Medical History:  Diagnosis Date  . Arthralgia    with Vytorin and myalgias   . Coronary atherosclerosis of unspecified type of vessel, native or graft    left main CAD, 10/2010-Dr. V and Dr. Cyndia Bent  . Diabetes ([place])   . GERD (gastroesophageal reflux disease)   . History of kidney stones   . History of tobacco use   . Hyperplastic colon polyp    2, 2008, repeat colonoscopy in 2018  . Low back pain    associated with degenerative disc disease   . Mixed hyperlipidemia   . Shoulder pain   . Type II diabetes mellitus ([place])     Past Surgical History:  Procedure Laterality Date  . BACK SURGERY     fusion for fracture verterbre   . CARDIAC CATHETERIZATION  2012  . CORONARY ARTERY BYPASS GRAFT  2012  . KIDNEY STONE SURGERY  x 3  . TOTAL KNEE ARTHROPLASTY Left 04/03/2014   DR DALLDORF  . TOTAL KNEE ARTHROPLASTY Left 04/03/2014   Procedure: TOTAL KNEE ARTHROPLASTY;  Surgeon: Hessie Dibble, MD;  Location: [facility];  Service: Orthopedics;  Laterality: Left;     Current Outpatient Prescriptions  Medication Sig Dispense Refill  . aspirin 325 MG tablet Take 325 mg by mouth daily.     Marland Kitchen atorvastatin (LIPITOR) 10 MG tablet Take 10 mg by mouth daily.    . cholecalciferol (VITAMIN D) 1000 UNITS tablet Take 1,000 Units by mouth daily.    . Coenzyme Q10 (COQ10) 100 MG CAPS Take 100 mg by mouth daily.    Marland Kitchen lisinopril (PRINIVIL,ZESTRIL) 2.5 MG tablet Take 2.5 mg by mouth daily.    . metFORMIN (GLUCOPHAGE-XR) 500 MG 24 hr tablet Take 500 mg by mouth 2 (two) times daily.    . metoprolol tartrate (LOPRESSOR) 25 MG tablet Take 25 mg by mouth 2 (two) times daily.    . nitroGLYCERIN (NITROSTAT) 0.4 MG SL tablet Place 0.4 mg under the tongue every 5 (five) minutes as needed for chest pain.    . Omega-3 Fatty Acids (FISH OIL) 1000 MG CAPS Take 1,000 mg by mouth daily.    . ONE TOUCH ULTRA TEST test strip 1 each by Other route as needed for other.     . rosuvastatin (CRESTOR) 5 MG tablet Take 5 mg by mouth daily at 6 PM.     . saxagliptin HCl (ONGLYZA) 5 MG TABS tablet Take 5 mg by mouth daily.    . tamsulosin (FLOMAX) 0.4 MG CAPS  capsule Take 0.4 mg by mouth daily after supper.     No current facility-administered medications for this visit.     Allergies:   Ezetimibe-simvastatin    Social History:  The patient  reports that he quit smoking about 33 years ago. His smoking use included Cigarettes. He has a 20.00 pack-year smoking history. He has never used smokeless tobacco. He reports that he does not drink alcohol or use drugs.   Family History:  The patient's family history includes Heart attack in his brother; Heart disease in his father; Stroke in his father.    ROS:  Please see the history of present illness.   Otherwise, review of systems are positive for foot pain.   All other systems are reviewed and negative.    PHYSICAL EXAM: VS:  BP (!) 118/58   Pulse (!) 57   Ht 5\' 8"  (1.727 m)   Wt 161 lb 6.4 oz (73.2 kg)   SpO2 97%   BMI 24.54 kg/m  , BMI Body mass index is 24.54 kg/m. GEN: Well nourished, well developed, in no acute distress  HEENT: normal  Neck: no JVD,  carotid bruits, or masses Cardiac: RRR; 2 out of 6 systolic murmur, no rubs, or gallops,no edema  Respiratory:  clear to auscultation bilaterally, normal work of breathing GI: soft, nontender, nondistended, + BS MS: no deformity or atrophy  Skin: warm and dry, no rash Neuro:  Strength and sensation are intact Psych: euthymic mood, full affect   EKG:   The ekg ordered today demonstrates NSR, no ST segment changes   Recent Labs: No results found for requested labs within last 8760 hours.   Lipid Panel    Component Value Date/Time   CHOL  10/12/2010 0430    117        ATP III CLASSIFICATION:  <200     mg/dL   Desirable  200-239  mg/dL   Borderline High  >=240    mg/dL   High          TRIG 82 10/12/2010 0430   HDL 29 (L) 10/12/2010 0430   CHOLHDL 4.0 10/12/2010 0430   VLDL 16 10/12/2010 0430   LDLCALC  10/12/2010 0430    72        Total Cholesterol/HDL:CHD Risk Coronary Heart Disease Risk Table                     Men   Women  1/2 Average Risk   3.4   3.3  Average Risk       5.0   4.4  2 X Average Risk   9.6   7.1  3 X Average Risk  23.4   11.0        Use the calculated Patient Ratio above and the CHD Risk Table to determine the patient's CHD Risk.        ATP III CLASSIFICATION (LDL):  <100     mg/dL   Optimal  100-129  mg/dL   Near or Above                    Optimal  130-159  mg/dL   Borderline  160-189  mg/dL   High  >190     mg/dL   Very High     Other studies Reviewed: Additional studies/ records that were reviewed today with results demonstrating: CABG report reviewed   ASSESSMENT AND PLAN:  1. CAD: No angina.  Continue aggressive medical therapy.  Decrease aspirin to 81 mg daily. 2. Hyperlipidemia: Lipids were reviewed and well controlled.  LDL 53 in February 2018 3. DM: Per his report, last A1c was less than 7 a few months ago.  Last documented A1c was 7.1, in February 2018.  Increase exercise. 4. Daytime sleepiness: Still an issue but no observed  apnea or snoring by the wife. 5. Murmur: No symptoms.  Sounds like aortic sclerosis murmur.  If he develops symptoms, could pursue echocardiogram.   Current medicines are reviewed at length with the patient today.  The patient concerns regarding his medicines were addressed.  The following changes have been made:  No change  Labs/ tests ordered today include:  No orders of the defined types were placed in this encounter.   Recommend 150 minutes/week of aerobic exercise Low fat, low carb, high fiber diet recommended  Disposition:   FU in 1 year   Signed, Larae Grooms, MD  06/07/2017 10:07 AM    [facility] [place], [place], [place]  [address] Phone: [phone]; Fax: [phone]

## 2017-06-07 ENCOUNTER — Ambulatory Visit (INDEPENDENT_AMBULATORY_CARE_PROVIDER_SITE_OTHER): Payer: Medicare HMO | Admitting: Interventional Cardiology

## 2017-06-07 ENCOUNTER — Encounter: Payer: Self-pay | Admitting: Interventional Cardiology

## 2017-06-07 VITALS — BP 118/58 | HR 57 | Ht 68.0 in | Wt 161.4 lb

## 2017-06-07 DIAGNOSIS — I2510 Atherosclerotic heart disease of native coronary artery without angina pectoris: Secondary | ICD-10-CM

## 2017-06-07 DIAGNOSIS — E782 Mixed hyperlipidemia: Secondary | ICD-10-CM

## 2017-06-07 DIAGNOSIS — I10 Essential (primary) hypertension: Secondary | ICD-10-CM | POA: Diagnosis not present

## 2017-06-07 DIAGNOSIS — E1159 Type 2 diabetes mellitus with other circulatory complications: Secondary | ICD-10-CM

## 2017-06-07 MED ORDER — ASPIRIN EC 81 MG PO TBEC: 81.0000 mg | DELAYED_RELEASE_TABLET | Freq: Every day | ORAL | 3 refills | Status: AC

## 2017-06-07 NOTE — Patient Instructions (Addendum)
Medication Instructions:  Your physician has recommended you make the following change in your medication:   DECREASE Aspirin to 81 mg daily  Labwork: None ordered  Testing/Procedures: None ordered   Follow-Up: Your physician wants you to follow-up in: 1 year with Dr. Irish Lack. You will receive a reminder letter in the mail two months in advance. If you don't receive a letter, please call our office to schedule the follow-up appointment.   Any Other Special Instructions Will Be Listed Below (If Applicable).     If you need a refill on your cardiac medications before your next appointment, please call your pharmacy.

## 2017-07-05 DIAGNOSIS — H40053 Ocular hypertension, bilateral: Secondary | ICD-10-CM | POA: Diagnosis not present

## 2017-07-05 DIAGNOSIS — H40033 Anatomical narrow angle, bilateral: Secondary | ICD-10-CM | POA: Diagnosis not present

## 2017-07-05 DIAGNOSIS — H40013 Open angle with borderline findings, low risk, bilateral: Secondary | ICD-10-CM | POA: Diagnosis not present

## 2017-07-12 DIAGNOSIS — L309 Dermatitis, unspecified: Secondary | ICD-10-CM | POA: Diagnosis not present

## 2017-07-12 DIAGNOSIS — E559 Vitamin D deficiency, unspecified: Secondary | ICD-10-CM | POA: Diagnosis not present

## 2017-07-12 DIAGNOSIS — E1165 Type 2 diabetes mellitus with hyperglycemia: Secondary | ICD-10-CM | POA: Diagnosis not present

## 2017-07-12 DIAGNOSIS — N400 Enlarged prostate without lower urinary tract symptoms: Secondary | ICD-10-CM | POA: Diagnosis not present

## 2017-07-12 DIAGNOSIS — K635 Polyp of colon: Secondary | ICD-10-CM | POA: Diagnosis not present

## 2017-07-12 DIAGNOSIS — M545 Low back pain: Secondary | ICD-10-CM | POA: Diagnosis not present

## 2017-07-12 DIAGNOSIS — K219 Gastro-esophageal reflux disease without esophagitis: Secondary | ICD-10-CM | POA: Diagnosis not present

## 2017-07-12 DIAGNOSIS — Z1211 Encounter for screening for malignant neoplasm of colon: Secondary | ICD-10-CM | POA: Diagnosis not present

## 2017-07-12 DIAGNOSIS — E782 Mixed hyperlipidemia: Secondary | ICD-10-CM | POA: Diagnosis not present

## 2017-07-12 DIAGNOSIS — I2510 Atherosclerotic heart disease of native coronary artery without angina pectoris: Secondary | ICD-10-CM | POA: Diagnosis not present

## 2017-08-16 DIAGNOSIS — R69 Illness, unspecified: Secondary | ICD-10-CM | POA: Diagnosis not present

## 2017-08-16 DIAGNOSIS — H40013 Open angle with borderline findings, low risk, bilateral: Secondary | ICD-10-CM | POA: Diagnosis not present

## 2017-08-16 DIAGNOSIS — H40053 Ocular hypertension, bilateral: Secondary | ICD-10-CM | POA: Diagnosis not present

## 2017-08-16 DIAGNOSIS — H40033 Anatomical narrow angle, bilateral: Secondary | ICD-10-CM | POA: Diagnosis not present

## 2017-11-26 DIAGNOSIS — H40033 Anatomical narrow angle, bilateral: Secondary | ICD-10-CM | POA: Diagnosis not present

## 2017-11-26 DIAGNOSIS — H40053 Ocular hypertension, bilateral: Secondary | ICD-10-CM | POA: Diagnosis not present

## 2017-11-26 DIAGNOSIS — H40013 Open angle with borderline findings, low risk, bilateral: Secondary | ICD-10-CM | POA: Diagnosis not present

## 2017-12-15 DIAGNOSIS — R69 Illness, unspecified: Secondary | ICD-10-CM | POA: Diagnosis not present

## 2018-01-17 DIAGNOSIS — K219 Gastro-esophageal reflux disease without esophagitis: Secondary | ICD-10-CM | POA: Diagnosis not present

## 2018-01-17 DIAGNOSIS — Z1389 Encounter for screening for other disorder: Secondary | ICD-10-CM | POA: Diagnosis not present

## 2018-01-17 DIAGNOSIS — Z79899 Other long term (current) drug therapy: Secondary | ICD-10-CM | POA: Diagnosis not present

## 2018-01-17 DIAGNOSIS — Z7984 Long term (current) use of oral hypoglycemic drugs: Secondary | ICD-10-CM | POA: Diagnosis not present

## 2018-01-17 DIAGNOSIS — E782 Mixed hyperlipidemia: Secondary | ICD-10-CM | POA: Diagnosis not present

## 2018-01-17 DIAGNOSIS — Z0001 Encounter for general adult medical examination with abnormal findings: Secondary | ICD-10-CM | POA: Diagnosis not present

## 2018-01-17 DIAGNOSIS — I2510 Atherosclerotic heart disease of native coronary artery without angina pectoris: Secondary | ICD-10-CM | POA: Diagnosis not present

## 2018-01-17 DIAGNOSIS — Z1159 Encounter for screening for other viral diseases: Secondary | ICD-10-CM | POA: Diagnosis not present

## 2018-01-17 DIAGNOSIS — E1165 Type 2 diabetes mellitus with hyperglycemia: Secondary | ICD-10-CM | POA: Diagnosis not present

## 2018-02-25 DIAGNOSIS — H40033 Anatomical narrow angle, bilateral: Secondary | ICD-10-CM | POA: Diagnosis not present

## 2018-02-25 DIAGNOSIS — H40013 Open angle with borderline findings, low risk, bilateral: Secondary | ICD-10-CM | POA: Diagnosis not present

## 2018-02-25 DIAGNOSIS — H40053 Ocular hypertension, bilateral: Secondary | ICD-10-CM | POA: Diagnosis not present

## 2018-03-29 DIAGNOSIS — R69 Illness, unspecified: Secondary | ICD-10-CM | POA: Diagnosis not present

## 2018-05-19 DIAGNOSIS — H401131 Primary open-angle glaucoma, bilateral, mild stage: Secondary | ICD-10-CM | POA: Diagnosis not present

## 2018-05-19 DIAGNOSIS — H353132 Nonexudative age-related macular degeneration, bilateral, intermediate dry stage: Secondary | ICD-10-CM | POA: Diagnosis not present

## 2018-05-19 DIAGNOSIS — E119 Type 2 diabetes mellitus without complications: Secondary | ICD-10-CM | POA: Diagnosis not present

## 2018-05-19 DIAGNOSIS — H35033 Hypertensive retinopathy, bilateral: Secondary | ICD-10-CM | POA: Diagnosis not present

## 2018-07-18 DIAGNOSIS — Z79899 Other long term (current) drug therapy: Secondary | ICD-10-CM | POA: Diagnosis not present

## 2018-07-18 DIAGNOSIS — E782 Mixed hyperlipidemia: Secondary | ICD-10-CM | POA: Diagnosis not present

## 2018-07-18 DIAGNOSIS — K219 Gastro-esophageal reflux disease without esophagitis: Secondary | ICD-10-CM | POA: Diagnosis not present

## 2018-07-18 DIAGNOSIS — E559 Vitamin D deficiency, unspecified: Secondary | ICD-10-CM | POA: Diagnosis not present

## 2018-07-18 DIAGNOSIS — I2510 Atherosclerotic heart disease of native coronary artery without angina pectoris: Secondary | ICD-10-CM | POA: Diagnosis not present

## 2018-07-18 DIAGNOSIS — E1165 Type 2 diabetes mellitus with hyperglycemia: Secondary | ICD-10-CM | POA: Diagnosis not present

## 2018-07-18 DIAGNOSIS — Z5181 Encounter for therapeutic drug level monitoring: Secondary | ICD-10-CM | POA: Diagnosis not present

## 2018-07-18 DIAGNOSIS — N401 Enlarged prostate with lower urinary tract symptoms: Secondary | ICD-10-CM | POA: Diagnosis not present

## 2018-08-08 NOTE — Progress Notes (Signed)
Cardiology Office Note   Date:  08/09/2018   ID:  Derrick, Edwards 04-Oct-1945, MRN [id number]  PCP:  Derrick Huddle, MD    No chief complaint on file.  CAD  Wt Readings from Last 3 Encounters:  08/09/18 74.5 kg  06/07/17 73.2 kg  06/03/16 74.8 kg       History of Present Illness: Derrick Edwards is a 72 y.o. male  who had CABG, 3 vessel,  in 2012.  He had left knee replacement in 8/15.  In 2018, it was noted that: "Not walking much due to chronic foot pain.   He is eligible to use the [employer], but he has never gone.  He can have membership for free.  He still has issues with falling asleep easily when he sits in a recliner."  Denies : Chest pain. Dizziness. Leg edema. Nitroglycerin use. Orthopnea. Palpitations. Paroxysmal nocturnal dyspnea. Shortness of breath. Syncope.   Ankle and feet problems limit walking.   He is eligible to use the [employer], but he has never gone.  Wife states "he won't go."   Past Medical History:  Diagnosis Date  . Arthralgia    with Vytorin and myalgias   . Coronary atherosclerosis of unspecified type of vessel, native or graft    left main CAD, 10/2010-Dr. V and Dr. Cyndia Bent  . Diabetes ([place])   . GERD (gastroesophageal reflux disease)   . History of kidney stones   . History of tobacco use   . Hyperplastic colon polyp    2, 2008, repeat colonoscopy in 2018  . Low back pain    associated with degenerative disc disease   . Mixed hyperlipidemia   . Shoulder pain   . Type II diabetes mellitus ([place])     Past Surgical History:  Procedure Laterality Date  . BACK SURGERY     fusion for fracture verterbre   . CARDIAC CATHETERIZATION  2012  . CORONARY ARTERY BYPASS GRAFT  2012  . KIDNEY STONE SURGERY  x 3  . TOTAL KNEE ARTHROPLASTY Left 04/03/2014   DR DALLDORF  . TOTAL KNEE ARTHROPLASTY Left 04/03/2014   Procedure: TOTAL KNEE ARTHROPLASTY;  Surgeon: Hessie Dibble, MD;  Location: [facility];  Service: Orthopedics;  Laterality: Left;      Current Outpatient Medications  Medication Sig Dispense Refill  . aspirin EC 81 MG tablet Take 1 tablet (81 mg total) by mouth daily. 90 tablet 3  . atorvastatin (LIPITOR) 10 MG tablet Take 10 mg by mouth daily.    . cholecalciferol (VITAMIN D) 1000 UNITS tablet Take 1,000 Units by mouth daily.    . Coenzyme Q10 (COQ10) 100 MG CAPS Take 100 mg by mouth daily.    Marland Kitchen lisinopril (PRINIVIL,ZESTRIL) 2.5 MG tablet Take 2.5 mg by mouth daily.    . metFORMIN (GLUCOPHAGE-XR) 500 MG 24 hr tablet Take 500 mg by mouth 3 (three) times daily.     . metoprolol tartrate (LOPRESSOR) 25 MG tablet Take 25 mg by mouth 2 (two) times daily.    . nitroGLYCERIN (NITROSTAT) 0.4 MG SL tablet Place 0.4 mg under the tongue every 5 (five) minutes as needed for chest pain.    . Omega-3 Fatty Acids (FISH OIL) 1000 MG CAPS Take 1,000 mg by mouth daily.    . ONE TOUCH ULTRA TEST test strip 1 each by Other route as needed for other.     . rosuvastatin (CRESTOR) 5 MG tablet Take 5 mg by mouth daily at 6  PM.     . tamsulosin (FLOMAX) 0.4 MG CAPS capsule Take 0.4 mg by mouth daily after supper.     No current facility-administered medications for this visit.     Allergies:   Ezetimibe-simvastatin    Social History:  The patient  reports that he quit smoking about 35 years ago. His smoking use included cigarettes. He has a 20.00 pack-year smoking history. He has never used smokeless tobacco. He reports that he does not drink alcohol or use drugs.   Family History:  The patient's family history includes Heart attack in his brother; Heart disease in his father; Stroke in his father.    ROS:  Please see the history of present illness.   Otherwise, review of systems are positive for falls asleep easily.   All other systems are reviewed and negative.    PHYSICAL EXAM: VS:  BP 114/60   Pulse 65   Ht 5\' 8"  (1.727 m)   Wt 74.5 kg   SpO2 96%   BMI 24.97 kg/m  , BMI Body mass index is 24.97 kg/m. GEN: Well nourished,  well developed, in no acute distress  HEENT: normal  Neck: no JVD, carotid bruits, or masses Cardiac: RRR; harsh early systolic murmur, no rubs, or gallops,no edema  Respiratory:  clear to auscultation bilaterally, normal work of breathing GI: soft, nontender, nondistended, + BS MS: no deformity or atrophy  Skin: warm and dry, no rash Neuro:  Strength and sensation are intact Psych: euthymic mood, full affect   EKG:   The ekg ordered today demonstrates NSR, , prolonged PR interval, no ST changes   Recent Labs: No results found for requested labs within last 8760 hours.   Lipid Panel    Component Value Date/Time   CHOL  10/12/2010 0430    117        ATP III CLASSIFICATION:  <200     mg/dL   Desirable  200-239  mg/dL   Borderline High  >=240    mg/dL   High          TRIG 82 10/12/2010 0430   HDL 29 (L) 10/12/2010 0430   CHOLHDL 4.0 10/12/2010 0430   VLDL 16 10/12/2010 0430   LDLCALC  10/12/2010 0430    72        Total Cholesterol/HDL:CHD Risk Coronary Heart Disease Risk Table                     Men   Women  1/2 Average Risk   3.4   3.3  Average Risk       5.0   4.4  2 X Average Risk   9.6   7.1  3 X Average Risk  23.4   11.0        Use the calculated Patient Ratio above and the CHD Risk Table to determine the patient's CHD Risk.        ATP III CLASSIFICATION (LDL):  <100     mg/dL   Optimal  100-129  mg/dL   Near or Above                    Optimal  130-159  mg/dL   Borderline  160-189  mg/dL   High  >190     mg/dL   Very High     Other studies Reviewed: Additional studies/ records that were reviewed today with results demonstrating: labs reviewed: A1C 7.2; LDL 71 in 12/19.   ASSESSMENT  AND PLAN:  1. CAD: No angina.  COntinue aggressive secondary prevention.  2. Hyperlipidemia: The current medical regimen is effective;  continue present plan and medications. 3. DM: Followed by PMD.   4. Daytime sleepiness: Mild. Wife will watch for signs of  apnea. 5. Murmur: likely some degree of aortic stenosis.  We went over the sx of severe AS.  She will also watch him for these sx.  Nothing as of yet.  Plan for echo if he has sx.    Current medicines are reviewed at length with the patient today.  The patient concerns regarding his medicines were addressed.  The following changes have been made:  No change  Labs/ tests ordered today include:  No orders of the defined types were placed in this encounter.   Recommend 150 minutes/week of aerobic exercise Low fat, low carb, high fiber diet recommended  Disposition:   FU in 1 year   Signed, Larae Grooms, MD  08/09/2018 3:33 PM    [facility] [place], [place],   [address] Phone: [phone]; Fax: [phone]

## 2018-08-09 ENCOUNTER — Ambulatory Visit: Payer: Medicare HMO | Admitting: Interventional Cardiology

## 2018-08-09 ENCOUNTER — Encounter: Payer: Self-pay | Admitting: Interventional Cardiology

## 2018-08-09 VITALS — BP 114/60 | HR 65 | Ht 68.0 in | Wt 164.2 lb

## 2018-08-09 DIAGNOSIS — E782 Mixed hyperlipidemia: Secondary | ICD-10-CM | POA: Diagnosis not present

## 2018-08-09 DIAGNOSIS — I2510 Atherosclerotic heart disease of native coronary artery without angina pectoris: Secondary | ICD-10-CM | POA: Diagnosis not present

## 2018-08-09 DIAGNOSIS — E1159 Type 2 diabetes mellitus with other circulatory complications: Secondary | ICD-10-CM | POA: Diagnosis not present

## 2018-08-09 DIAGNOSIS — I350 Nonrheumatic aortic (valve) stenosis: Secondary | ICD-10-CM

## 2018-08-09 NOTE — Patient Instructions (Signed)

## 2018-08-23 DIAGNOSIS — R69 Illness, unspecified: Secondary | ICD-10-CM | POA: Diagnosis not present

## 2018-12-14 DIAGNOSIS — R69 Illness, unspecified: Secondary | ICD-10-CM | POA: Diagnosis not present

## 2019-01-03 DIAGNOSIS — Z7982 Long term (current) use of aspirin: Secondary | ICD-10-CM | POA: Diagnosis not present

## 2019-01-03 DIAGNOSIS — N400 Enlarged prostate without lower urinary tract symptoms: Secondary | ICD-10-CM | POA: Diagnosis not present

## 2019-01-03 DIAGNOSIS — Z7984 Long term (current) use of oral hypoglycemic drugs: Secondary | ICD-10-CM | POA: Diagnosis not present

## 2019-01-03 DIAGNOSIS — E1159 Type 2 diabetes mellitus with other circulatory complications: Secondary | ICD-10-CM | POA: Diagnosis not present

## 2019-01-03 DIAGNOSIS — N529 Male erectile dysfunction, unspecified: Secondary | ICD-10-CM | POA: Diagnosis not present

## 2019-01-03 DIAGNOSIS — E1140 Type 2 diabetes mellitus with diabetic neuropathy, unspecified: Secondary | ICD-10-CM | POA: Diagnosis not present

## 2019-01-03 DIAGNOSIS — I2510 Atherosclerotic heart disease of native coronary artery without angina pectoris: Secondary | ICD-10-CM | POA: Diagnosis not present

## 2019-01-03 DIAGNOSIS — E1165 Type 2 diabetes mellitus with hyperglycemia: Secondary | ICD-10-CM | POA: Diagnosis not present

## 2019-01-03 DIAGNOSIS — E785 Hyperlipidemia, unspecified: Secondary | ICD-10-CM | POA: Diagnosis not present

## 2019-01-03 DIAGNOSIS — I10 Essential (primary) hypertension: Secondary | ICD-10-CM | POA: Diagnosis not present

## 2019-01-27 DIAGNOSIS — E559 Vitamin D deficiency, unspecified: Secondary | ICD-10-CM | POA: Diagnosis not present

## 2019-01-27 DIAGNOSIS — Z5181 Encounter for therapeutic drug level monitoring: Secondary | ICD-10-CM | POA: Diagnosis not present

## 2019-01-27 DIAGNOSIS — E782 Mixed hyperlipidemia: Secondary | ICD-10-CM | POA: Diagnosis not present

## 2019-01-27 DIAGNOSIS — Z1389 Encounter for screening for other disorder: Secondary | ICD-10-CM | POA: Diagnosis not present

## 2019-01-27 DIAGNOSIS — E1165 Type 2 diabetes mellitus with hyperglycemia: Secondary | ICD-10-CM | POA: Diagnosis not present

## 2019-01-27 DIAGNOSIS — Z0001 Encounter for general adult medical examination with abnormal findings: Secondary | ICD-10-CM | POA: Diagnosis not present

## 2019-01-27 DIAGNOSIS — K219 Gastro-esophageal reflux disease without esophagitis: Secondary | ICD-10-CM | POA: Diagnosis not present

## 2019-01-27 DIAGNOSIS — I2510 Atherosclerotic heart disease of native coronary artery without angina pectoris: Secondary | ICD-10-CM | POA: Diagnosis not present

## 2019-01-27 DIAGNOSIS — N401 Enlarged prostate with lower urinary tract symptoms: Secondary | ICD-10-CM | POA: Diagnosis not present

## 2019-01-27 DIAGNOSIS — Z79899 Other long term (current) drug therapy: Secondary | ICD-10-CM | POA: Diagnosis not present

## 2019-02-23 DIAGNOSIS — H401122 Primary open-angle glaucoma, left eye, moderate stage: Secondary | ICD-10-CM | POA: Diagnosis not present

## 2019-02-23 DIAGNOSIS — H401111 Primary open-angle glaucoma, right eye, mild stage: Secondary | ICD-10-CM | POA: Diagnosis not present

## 2019-02-23 DIAGNOSIS — H40033 Anatomical narrow angle, bilateral: Secondary | ICD-10-CM | POA: Diagnosis not present

## 2019-04-20 DIAGNOSIS — R69 Illness, unspecified: Secondary | ICD-10-CM | POA: Diagnosis not present

## 2019-05-24 DIAGNOSIS — H401122 Primary open-angle glaucoma, left eye, moderate stage: Secondary | ICD-10-CM | POA: Diagnosis not present

## 2019-05-24 DIAGNOSIS — H353132 Nonexudative age-related macular degeneration, bilateral, intermediate dry stage: Secondary | ICD-10-CM | POA: Diagnosis not present

## 2019-05-24 DIAGNOSIS — H35033 Hypertensive retinopathy, bilateral: Secondary | ICD-10-CM | POA: Diagnosis not present

## 2019-05-24 DIAGNOSIS — H401111 Primary open-angle glaucoma, right eye, mild stage: Secondary | ICD-10-CM | POA: Diagnosis not present

## 2019-06-28 DIAGNOSIS — R69 Illness, unspecified: Secondary | ICD-10-CM | POA: Diagnosis not present

## 2019-07-26 DIAGNOSIS — I2510 Atherosclerotic heart disease of native coronary artery without angina pectoris: Secondary | ICD-10-CM | POA: Diagnosis not present

## 2019-07-26 DIAGNOSIS — E782 Mixed hyperlipidemia: Secondary | ICD-10-CM | POA: Diagnosis not present

## 2019-07-26 DIAGNOSIS — E1165 Type 2 diabetes mellitus with hyperglycemia: Secondary | ICD-10-CM | POA: Diagnosis not present

## 2019-08-06 NOTE — Progress Notes (Signed)
Cardiology Office Note   Date:  08/09/2019   ID:  Treshon, Groesbeck 22-Dec-1945, MRN [id number]  PCP:  Josetta Huddle, MD    No chief complaint on file.  CAD  Wt Readings from Last 3 Encounters:  08/09/19 162 lb 3.2 oz (73.6 kg)  08/09/18 164 lb 3.2 oz (74.5 kg)  06/07/17 161 lb 6.4 oz (73.2 kg)       History of Present Illness: Derrick Edwards is a 73 y.o. male  who had CABG, 3 vessel,in 2012.  He had left knee replacement in 8/15.  In 2018, it was noted that: "Not walking much due to chronic foot pain.  In the past, he was eligible to use the [employer], but he has never gone. He can have membership for free.  Wife stated "he won't go."  He still has issues with falling asleep easily when he sits in a recliner."  Since the last visit, he has had some right knee pain.  It is not to the point that he would need TKR.    He admits to not walking that much.    He does avoid crowds and wears a mask if he goes out.  Denies : Chest pain. Dizziness. Leg edema. Nitroglycerin use. Orthopnea. Palpitations. Paroxysmal nocturnal dyspnea. Shortness of breath. Syncope.     Past Medical History:  Diagnosis Date  . Arthralgia    with Vytorin and myalgias   . Coronary atherosclerosis of unspecified type of vessel, native or graft    left main CAD, 10/2010-Dr. V and Dr. Cyndia Bent  . Diabetes ([place])   . GERD (gastroesophageal reflux disease)   . History of kidney stones   . History of tobacco use   . Hyperplastic colon polyp    2, 2008, repeat colonoscopy in 2018  . Low back pain    associated with degenerative disc disease   . Mixed hyperlipidemia   . Shoulder pain   . Type II diabetes mellitus ([place])     Past Surgical History:  Procedure Laterality Date  . BACK SURGERY     fusion for fracture verterbre   . CARDIAC CATHETERIZATION  2012  . CORONARY ARTERY BYPASS GRAFT  2012  . KIDNEY STONE SURGERY  x 3  . TOTAL KNEE ARTHROPLASTY Left 04/03/2014   DR DALLDORF  . TOTAL  KNEE ARTHROPLASTY Left 04/03/2014   Procedure: TOTAL KNEE ARTHROPLASTY;  Surgeon: Hessie Dibble, MD;  Location: [facility];  Service: Orthopedics;  Laterality: Left;     Current Outpatient Medications  Medication Sig Dispense Refill  . aspirin EC 81 MG tablet Take 1 tablet (81 mg total) by mouth daily. 90 tablet 3  . atorvastatin (LIPITOR) 10 MG tablet Take 10 mg by mouth daily.    . cholecalciferol (VITAMIN D) 1000 UNITS tablet Take 1,000 Units by mouth daily.    . Coenzyme Q10 (COQ10) 100 MG CAPS Take 100 mg by mouth daily.    Marland Kitchen lisinopril (PRINIVIL,ZESTRIL) 2.5 MG tablet Take 2.5 mg by mouth daily.    . metFORMIN (GLUCOPHAGE-XR) 500 MG 24 hr tablet Take 500 mg by mouth 3 (three) times daily.     . metoprolol tartrate (LOPRESSOR) 25 MG tablet Take 25 mg by mouth 2 (two) times daily.    . nitroGLYCERIN (NITROSTAT) 0.4 MG SL tablet Place 0.4 mg under the tongue every 5 (five) minutes as needed for chest pain.    . Omega-3 Fatty Acids (FISH OIL) 1000 MG CAPS Take 1,000 mg  by mouth daily.    . ONE TOUCH ULTRA TEST test strip 1 each by Other route as needed for other.     . rosuvastatin (CRESTOR) 5 MG tablet Take 5 mg by mouth daily at 6 PM.     . tamsulosin (FLOMAX) 0.4 MG CAPS capsule Take 0.4 mg by mouth daily after supper.     No current facility-administered medications for this visit.    Allergies:   Ezetimibe-simvastatin    Social History:  The patient  reports that he quit smoking about 36 years ago. His smoking use included cigarettes. He has a 20.00 pack-year smoking history. He has never used smokeless tobacco. He reports that he does not drink alcohol or use drugs.   Family History:  The patient's family history includes Heart attack in his brother; Heart disease in his father; Stroke in his father.    ROS:  Please see the history of present illness.   Otherwise, review of systems are positive for difficulty getting A1C down.   All other systems are reviewed and negative.     PHYSICAL EXAM: VS:  BP 108/60   Pulse (!) 57   Ht 5\' 8"  (1.727 m)   Wt 162 lb 3.2 oz (73.6 kg)   SpO2 99%   BMI 24.66 kg/m  , BMI Body mass index is 24.66 kg/m. GEN: Well nourished, well developed, in no acute distress  HEENT: normal  Neck: no JVD, carotid bruits, or masses Cardiac: bradycardic; 2/6 systolic murmur, no rubs, or gallops,no edema  Respiratory:  clear to auscultation bilaterally, normal work of breathing GI: soft, nontender, nondistended, + BS MS: no deformity or atrophy  Skin: warm and dry, no rash Neuro:  Strength and sensation are intact Psych: euthymic mood, full affect   EKG:   The ekg ordered today demonstrates sinus bradycardia, no ST changes, prolonged PR interval   Recent Labs: No results found for requested labs within last 8760 hours.   Lipid Panel    Component Value Date/Time   CHOL  10/12/2010 0430    117        ATP III CLASSIFICATION:  <200     mg/dL   Desirable  200-239  mg/dL   Borderline High  >=240    mg/dL   High          TRIG 82 10/12/2010 0430   HDL 29 (L) 10/12/2010 0430   CHOLHDL 4.0 10/12/2010 0430   VLDL 16 10/12/2010 0430   LDLCALC  10/12/2010 0430    72        Total Cholesterol/HDL:CHD Risk Coronary Heart Disease Risk Table                     Men   Women  1/2 Average Risk   3.4   3.3  Average Risk       5.0   4.4  2 X Average Risk   9.6   7.1  3 X Average Risk  23.4   11.0        Use the calculated Patient Ratio above and the CHD Risk Table to determine the patient's CHD Risk.        ATP III CLASSIFICATION (LDL):  <100     mg/dL   Optimal  100-129  mg/dL   Near or Above                    Optimal  130-159  mg/dL   Borderline  160-189  mg/dL   High  >190     mg/dL   Very High     Other studies Reviewed: Additional studies/ records that were reviewed today with results demonstrating: labs reviewed.   ASSESSMENT AND PLAN:  1. CAD: No angina.  Continue aggressive secondary prevention.  Increase  activity. Decrease metoprolol to 12.5 mg BID given HR 57 and PR 262 ms.  2. Hyperlipidemia: Continue lipid lowering therapy.  Controlled in June 2020.  Clarify lipid lowering drug.  I believe it is atorvastatin. 3. DM: A1C increased to 7.6.  4. Daytime sleepiness: Still occurs. Not bothersome to him.  He just naps some during the day.  Watch for worsening sx and consider sleep apnea testing if sx get worse. 5. Murmur: likely some degree of AS.  Sounds mild.  If he develops sx or murmur gets worse, will consider echo.    Current medicines are reviewed at length with the patient today.  The patient concerns regarding his medicines were addressed.  The following changes have been made:  As above  Labs/ tests ordered today include:  No orders of the defined types were placed in this encounter.   Recommend 150 minutes/week of aerobic exercise Low fat, low carb, high fiber diet recommended  Disposition:   FU in 1 year   Signed, Larae Grooms, MD  08/09/2019 9:35 AM    [facility] [place], [place], [place]  [address] Phone: [phone]; Fax: [phone]

## 2019-08-09 ENCOUNTER — Encounter: Payer: Self-pay | Admitting: Interventional Cardiology

## 2019-08-09 ENCOUNTER — Ambulatory Visit: Payer: Medicare HMO | Admitting: Interventional Cardiology

## 2019-08-09 ENCOUNTER — Other Ambulatory Visit: Payer: Self-pay

## 2019-08-09 VITALS — BP 108/60 | HR 57 | Ht 68.0 in | Wt 162.2 lb

## 2019-08-09 DIAGNOSIS — N132 Hydronephrosis with renal and ureteral calculous obstruction: Secondary | ICD-10-CM | POA: Diagnosis not present

## 2019-08-09 DIAGNOSIS — I2510 Atherosclerotic heart disease of native coronary artery without angina pectoris: Secondary | ICD-10-CM

## 2019-08-09 DIAGNOSIS — E782 Mixed hyperlipidemia: Secondary | ICD-10-CM

## 2019-08-09 DIAGNOSIS — E1159 Type 2 diabetes mellitus with other circulatory complications: Secondary | ICD-10-CM

## 2019-08-09 DIAGNOSIS — E1165 Type 2 diabetes mellitus with hyperglycemia: Secondary | ICD-10-CM | POA: Diagnosis not present

## 2019-08-09 DIAGNOSIS — I350 Nonrheumatic aortic (valve) stenosis: Secondary | ICD-10-CM | POA: Diagnosis not present

## 2019-08-09 DIAGNOSIS — N401 Enlarged prostate with lower urinary tract symptoms: Secondary | ICD-10-CM | POA: Diagnosis not present

## 2019-08-09 DIAGNOSIS — N400 Enlarged prostate without lower urinary tract symptoms: Secondary | ICD-10-CM | POA: Diagnosis not present

## 2019-08-09 MED ORDER — METOPROLOL TARTRATE 25 MG PO TABS: 12.5000 mg | ORAL_TABLET | Freq: Two times a day (BID) | ORAL | 3 refills | Status: DC

## 2019-08-09 NOTE — Patient Instructions (Signed)
Medication Instructions:  Your physician has recommended you make the following change in your medication:   DECREASE: metoprolol tartrate (lopressor) 25 mg tablet: Take 1/2 tablet (12.5 mg) twice a day  *If you need a refill on your cardiac medications before your next appointment, please call your pharmacy*  Lab Work: None ordered  If you have labs (blood work) drawn today and your tests are completely normal, you will receive your results only by: Marland Kitchen MyChart Message (if you have MyChart) OR . A paper copy in the mail If you have any lab test that is abnormal or we need to change your treatment, we will call you to review the results.  Testing/Procedures: None ordered  Follow-Up: At [facility], you and your health needs are our priority.  As part of our continuing mission to provide you with exceptional heart care, we have created designated Provider Care Teams.  These Care Teams include your primary Cardiologist (physician) and Advanced Practice Providers (APPs -  Physician Assistants and Nurse Practitioners) who all work together to provide you with the care you need, when you need it.  Your next appointment:   12 month(s)  The format for your next appointment:   In Person  Provider:   You may see Larae Grooms, MD or one of the following Advanced Practice Providers on your designated Care Team:    Melina Copa, PA-C  Ermalinda Barrios, PA-C   Other Instructions  Carbohydrate Counting for Diabetes Mellitus, Adult  Carbohydrate counting is a method of keeping track of how many carbohydrates you eat. Eating carbohydrates naturally increases the amount of sugar (glucose) in the blood. Counting how many carbohydrates you eat helps keep your blood glucose within normal limits, which helps you manage your diabetes (diabetes mellitus). It is important to know how many carbohydrates you can safely have in each meal. This is different for every person. A diet and nutrition  specialist (registered dietitian) can help you make a meal plan and calculate how many carbohydrates you should have at each meal and snack. Carbohydrates are found in the following foods:  Grains, such as breads and cereals.  Dried beans and soy products.  Starchy vegetables, such as potatoes, peas, and corn.  Fruit and fruit juices.  Milk and yogurt.  Sweets and snack foods, such as cake, cookies, candy, chips, and soft drinks. How do I count carbohydrates? There are two ways to count carbohydrates in food. You can use either of the methods or a combination of both. Reading "Nutrition Facts" on packaged food The "Nutrition Facts" list is included on the labels of almost all packaged foods and beverages in the [place] It includes:  The serving size.  Information about nutrients in each serving, including the grams (g) of carbohydrate per serving. To use the "Nutrition Facts":  Decide how many servings you will have.  Multiply the number of servings by the number of carbohydrates per serving.  The resulting number is the total amount of carbohydrates that you will be having. Learning standard serving sizes of other foods When you eat carbohydrate foods that are not packaged or do not include "Nutrition Facts" on the label, you need to measure the servings in order to count the amount of carbohydrates:  Measure the foods that you will eat with a food scale or measuring cup, if needed.  Decide how many standard-size servings you will eat.  Multiply the number of servings by 15. Most carbohydrate-rich foods have about 15 g of carbohydrates per  serving. ? For example, if you eat 8 oz (170 g) of strawberries, you will have eaten 2 servings and 30 g of carbohydrates (2 servings x 15 g = 30 g).  For foods that have more than one food mixed, such as soups and casseroles, you must count the carbohydrates in each food that is included. The following list contains standard serving sizes of  common carbohydrate-rich foods. Each of these servings has about 15 g of carbohydrates:   hamburger bun or  English muffin.   oz (15 mL) syrup.   oz (14 g) jelly.  1 slice of bread.  1 six-inch tortilla.  3 oz (85 g) cooked rice or pasta.  4 oz (113 g) cooked dried beans.  4 oz (113 g) starchy vegetable, such as peas, corn, or potatoes.  4 oz (113 g) hot cereal.  4 oz (113 g) mashed potatoes or  of a large baked potato.  4 oz (113 g) canned or frozen fruit.  4 oz (120 mL) fruit juice.  4-6 crackers.  6 chicken nuggets.  6 oz (170 g) unsweetened dry cereal.  6 oz (170 g) plain fat-free yogurt or yogurt sweetened with artificial sweeteners.  8 oz (240 mL) milk.  8 oz (170 g) fresh fruit or one small piece of fruit.  24 oz (680 g) popped popcorn. Example of carbohydrate counting Sample meal  3 oz (85 g) chicken breast.  6 oz (170 g) brown rice.  4 oz (113 g) corn.  8 oz (240 mL) milk.  8 oz (170 g) strawberries with sugar-free whipped topping. Carbohydrate calculation 1. Identify the foods that contain carbohydrates: ? Rice. ? Corn. ? Milk. ? Strawberries. 2. Calculate how many servings you have of each food: ? 2 servings rice. ? 1 serving corn. ? 1 serving milk. ? 1 serving strawberries. 3. Multiply each number of servings by 15 g: ? 2 servings rice x 15 g = 30 g. ? 1 serving corn x 15 g = 15 g. ? 1 serving milk x 15 g = 15 g. ? 1 serving strawberries x 15 g = 15 g. 4. Add together all of the amounts to find the total grams of carbohydrates eaten: ? 30 g + 15 g + 15 g + 15 g = 75 g of carbohydrates total. Summary  Carbohydrate counting is a method of keeping track of how many carbohydrates you eat.  Eating carbohydrates naturally increases the amount of sugar (glucose) in the blood.  Counting how many carbohydrates you eat helps keep your blood glucose within normal limits, which helps you manage your diabetes.  A diet and nutrition  specialist (registered dietitian) can help you make a meal plan and calculate how many carbohydrates you should have at each meal and snack. This information is not intended to replace advice given to you by your health care provider. Make sure you discuss any questions you have with your health care provider. Document Released: 07/27/2005 Document Revised: 02/18/2017 Document Reviewed: 01/08/2016 Elsevier Patient Education  2020 [employer].

## 2019-08-14 DIAGNOSIS — R69 Illness, unspecified: Secondary | ICD-10-CM | POA: Diagnosis not present

## 2019-08-23 DIAGNOSIS — H35033 Hypertensive retinopathy, bilateral: Secondary | ICD-10-CM | POA: Diagnosis not present

## 2019-08-23 DIAGNOSIS — H401111 Primary open-angle glaucoma, right eye, mild stage: Secondary | ICD-10-CM | POA: Diagnosis not present

## 2019-08-23 DIAGNOSIS — H353132 Nonexudative age-related macular degeneration, bilateral, intermediate dry stage: Secondary | ICD-10-CM | POA: Diagnosis not present

## 2019-08-23 DIAGNOSIS — H401122 Primary open-angle glaucoma, left eye, moderate stage: Secondary | ICD-10-CM | POA: Diagnosis not present

## 2019-09-26 DIAGNOSIS — E559 Vitamin D deficiency, unspecified: Secondary | ICD-10-CM | POA: Diagnosis not present

## 2019-09-26 DIAGNOSIS — N132 Hydronephrosis with renal and ureteral calculous obstruction: Secondary | ICD-10-CM | POA: Diagnosis not present

## 2019-09-26 DIAGNOSIS — N401 Enlarged prostate with lower urinary tract symptoms: Secondary | ICD-10-CM | POA: Diagnosis not present

## 2019-09-26 DIAGNOSIS — Z7984 Long term (current) use of oral hypoglycemic drugs: Secondary | ICD-10-CM | POA: Diagnosis not present

## 2019-09-26 DIAGNOSIS — E782 Mixed hyperlipidemia: Secondary | ICD-10-CM | POA: Diagnosis not present

## 2019-09-26 DIAGNOSIS — I2510 Atherosclerotic heart disease of native coronary artery without angina pectoris: Secondary | ICD-10-CM | POA: Diagnosis not present

## 2019-09-26 DIAGNOSIS — N400 Enlarged prostate without lower urinary tract symptoms: Secondary | ICD-10-CM | POA: Diagnosis not present

## 2019-09-26 DIAGNOSIS — E1165 Type 2 diabetes mellitus with hyperglycemia: Secondary | ICD-10-CM | POA: Diagnosis not present

## 2019-10-11 DIAGNOSIS — N401 Enlarged prostate with lower urinary tract symptoms: Secondary | ICD-10-CM | POA: Diagnosis not present

## 2019-10-11 DIAGNOSIS — N400 Enlarged prostate without lower urinary tract symptoms: Secondary | ICD-10-CM | POA: Diagnosis not present

## 2019-10-11 DIAGNOSIS — I2510 Atherosclerotic heart disease of native coronary artery without angina pectoris: Secondary | ICD-10-CM | POA: Diagnosis not present

## 2019-10-11 DIAGNOSIS — E782 Mixed hyperlipidemia: Secondary | ICD-10-CM | POA: Diagnosis not present

## 2019-10-11 DIAGNOSIS — E1165 Type 2 diabetes mellitus with hyperglycemia: Secondary | ICD-10-CM | POA: Diagnosis not present

## 2019-10-11 DIAGNOSIS — N132 Hydronephrosis with renal and ureteral calculous obstruction: Secondary | ICD-10-CM | POA: Diagnosis not present

## 2019-10-25 DIAGNOSIS — R69 Illness, unspecified: Secondary | ICD-10-CM | POA: Diagnosis not present

## 2019-11-22 DIAGNOSIS — N132 Hydronephrosis with renal and ureteral calculous obstruction: Secondary | ICD-10-CM | POA: Diagnosis not present

## 2019-11-22 DIAGNOSIS — N400 Enlarged prostate without lower urinary tract symptoms: Secondary | ICD-10-CM | POA: Diagnosis not present

## 2019-11-22 DIAGNOSIS — E1165 Type 2 diabetes mellitus with hyperglycemia: Secondary | ICD-10-CM | POA: Diagnosis not present

## 2019-11-22 DIAGNOSIS — N401 Enlarged prostate with lower urinary tract symptoms: Secondary | ICD-10-CM | POA: Diagnosis not present

## 2019-11-22 DIAGNOSIS — I2510 Atherosclerotic heart disease of native coronary artery without angina pectoris: Secondary | ICD-10-CM | POA: Diagnosis not present

## 2019-11-22 DIAGNOSIS — E782 Mixed hyperlipidemia: Secondary | ICD-10-CM | POA: Diagnosis not present

## 2019-12-20 DIAGNOSIS — H401122 Primary open-angle glaucoma, left eye, moderate stage: Secondary | ICD-10-CM | POA: Diagnosis not present

## 2019-12-20 DIAGNOSIS — H401111 Primary open-angle glaucoma, right eye, mild stage: Secondary | ICD-10-CM | POA: Diagnosis not present

## 2020-02-06 DIAGNOSIS — E782 Mixed hyperlipidemia: Secondary | ICD-10-CM | POA: Diagnosis not present

## 2020-02-06 DIAGNOSIS — Z1389 Encounter for screening for other disorder: Secondary | ICD-10-CM | POA: Diagnosis not present

## 2020-02-06 DIAGNOSIS — Z0001 Encounter for general adult medical examination with abnormal findings: Secondary | ICD-10-CM | POA: Diagnosis not present

## 2020-02-06 DIAGNOSIS — E559 Vitamin D deficiency, unspecified: Secondary | ICD-10-CM | POA: Diagnosis not present

## 2020-02-06 DIAGNOSIS — N401 Enlarged prostate with lower urinary tract symptoms: Secondary | ICD-10-CM | POA: Diagnosis not present

## 2020-02-06 DIAGNOSIS — I700 Atherosclerosis of aorta: Secondary | ICD-10-CM | POA: Diagnosis not present

## 2020-02-06 DIAGNOSIS — N132 Hydronephrosis with renal and ureteral calculous obstruction: Secondary | ICD-10-CM | POA: Diagnosis not present

## 2020-02-06 DIAGNOSIS — I2510 Atherosclerotic heart disease of native coronary artery without angina pectoris: Secondary | ICD-10-CM | POA: Diagnosis not present

## 2020-02-06 DIAGNOSIS — E1165 Type 2 diabetes mellitus with hyperglycemia: Secondary | ICD-10-CM | POA: Diagnosis not present

## 2020-02-06 DIAGNOSIS — Z79899 Other long term (current) drug therapy: Secondary | ICD-10-CM | POA: Diagnosis not present

## 2020-02-07 DIAGNOSIS — R69 Illness, unspecified: Secondary | ICD-10-CM | POA: Diagnosis not present

## 2020-05-29 DIAGNOSIS — R69 Illness, unspecified: Secondary | ICD-10-CM | POA: Diagnosis not present

## 2020-06-10 DIAGNOSIS — H2513 Age-related nuclear cataract, bilateral: Secondary | ICD-10-CM | POA: Diagnosis not present

## 2020-06-10 DIAGNOSIS — H401111 Primary open-angle glaucoma, right eye, mild stage: Secondary | ICD-10-CM | POA: Diagnosis not present

## 2020-06-10 DIAGNOSIS — H40033 Anatomical narrow angle, bilateral: Secondary | ICD-10-CM | POA: Diagnosis not present

## 2020-06-10 DIAGNOSIS — H401122 Primary open-angle glaucoma, left eye, moderate stage: Secondary | ICD-10-CM | POA: Diagnosis not present

## 2020-06-10 DIAGNOSIS — H353131 Nonexudative age-related macular degeneration, bilateral, early dry stage: Secondary | ICD-10-CM | POA: Diagnosis not present

## 2020-06-12 DIAGNOSIS — R195 Other fecal abnormalities: Secondary | ICD-10-CM | POA: Diagnosis not present

## 2020-06-21 DIAGNOSIS — R195 Other fecal abnormalities: Secondary | ICD-10-CM | POA: Diagnosis not present

## 2020-06-21 DIAGNOSIS — K219 Gastro-esophageal reflux disease without esophagitis: Secondary | ICD-10-CM | POA: Diagnosis not present

## 2020-08-11 NOTE — Progress Notes (Deleted)
Cardiology Office Note   Date:  08/11/2020   ID:  Derrick Edwards, Derrick Edwards 1945-12-15, MRN [id number]  PCP:  Josetta Huddle, MD    No chief complaint on file.  CAD  Wt Readings from Last 3 Encounters:  08/09/19 162 lb 3.2 oz (73.6 kg)  08/09/18 164 lb 3.2 oz (74.5 kg)  06/07/17 161 lb 6.4 oz (73.2 kg)       History of Present Illness: Derrick Edwards is a 75 y.o. male    who had CABG, 3 vessel,in 2012.  He had left knee replacement in 8/15.  In 2018, it was noted that: "Not walking much due to chronic foot pain.  In the past, he was eligible to use the [employer], but he has never gone. He can have membership for free.  Wife stated "he won't go."  He still has issues with falling asleep easily when he sits in a recliner."  He has had some right knee pain.  He has been trying to avoid TKR.    He admits to not walking that much.      Past Medical History:  Diagnosis Date  . Arthralgia    with Vytorin and myalgias   . Coronary atherosclerosis of unspecified type of vessel, native or graft    left main CAD, 10/2010-Dr. V and Dr. Cyndia Bent  . Diabetes ([place])   . GERD (gastroesophageal reflux disease)   . History of kidney stones   . History of tobacco use   . Hyperplastic colon polyp    2, 2008, repeat colonoscopy in 2018  . Low back pain    associated with degenerative disc disease   . Mixed hyperlipidemia   . Shoulder pain   . Type II diabetes mellitus ([place])     Past Surgical History:  Procedure Laterality Date  . BACK SURGERY     fusion for fracture verterbre   . CARDIAC CATHETERIZATION  2012  . CORONARY ARTERY BYPASS GRAFT  2012  . KIDNEY STONE SURGERY  x 3  . TOTAL KNEE ARTHROPLASTY Left 04/03/2014   DR DALLDORF  . TOTAL KNEE ARTHROPLASTY Left 04/03/2014   Procedure: TOTAL KNEE ARTHROPLASTY;  Surgeon: Hessie Dibble, MD;  Location: [facility];  Service: Orthopedics;  Laterality: Left;     Current Outpatient Medications  Medication Sig Dispense Refill  .  aspirin EC 81 MG tablet Take 1 tablet (81 mg total) by mouth daily. 90 tablet 3  . atorvastatin (LIPITOR) 10 MG tablet Take 10 mg by mouth daily.    . cholecalciferol (VITAMIN D) 1000 UNITS tablet Take 1,000 Units by mouth daily.    . Coenzyme Q10 (COQ10) 100 MG CAPS Take 100 mg by mouth daily.    Marland Kitchen lisinopril (PRINIVIL,ZESTRIL) 2.5 MG tablet Take 2.5 mg by mouth daily.    . metFORMIN (GLUCOPHAGE-XR) 500 MG 24 hr tablet Take 500 mg by mouth 3 (three) times daily.     . metoprolol tartrate (LOPRESSOR) 25 MG tablet Take 0.5 tablets (12.5 mg total) by mouth 2 (two) times daily. 90 tablet 3  . nitroGLYCERIN (NITROSTAT) 0.4 MG SL tablet Place 0.4 mg under the tongue every 5 (five) minutes as needed for chest pain.    . Omega-3 Fatty Acids (FISH OIL) 1000 MG CAPS Take 1,000 mg by mouth daily.    . ONE TOUCH ULTRA TEST test strip 1 each by Other route as needed for other.     . rosuvastatin (CRESTOR) 5 MG tablet Take 5 mg  by mouth daily at 6 PM.     . tamsulosin (FLOMAX) 0.4 MG CAPS capsule Take 0.4 mg by mouth daily after supper.     No current facility-administered medications for this visit.    Allergies:   Ezetimibe-simvastatin    Social History:  The patient  reports that he quit smoking about 37 years ago. His smoking use included cigarettes. He has a 20.00 pack-year smoking history. He has never used smokeless tobacco. He reports that he does not drink alcohol and does not use drugs.   Family History:  The patient's ***family history includes Heart attack in his brother; Heart disease in his father; Stroke in his father.    ROS:  Please see the history of present illness.   Otherwise, review of systems are positive for ***.   All other systems are reviewed and negative.    PHYSICAL EXAM: VS:  There were no vitals taken for this visit. , BMI There is no height or weight on file to calculate BMI. GEN: Well nourished, well developed, in no acute distress  HEENT: normal  Neck: no JVD,  carotid bruits, or masses Cardiac: ***RRR; no murmurs, rubs, or gallops,no edema  Respiratory:  clear to auscultation bilaterally, normal work of breathing GI: soft, nontender, nondistended, + BS MS: no deformity or atrophy  Skin: warm and dry, no rash Neuro:  Strength and sensation are intact Psych: euthymic mood, full affect   EKG:   The ekg ordered today demonstrates ***   Recent Labs: No results found for requested labs within last 8760 hours.   Lipid Panel    Component Value Date/Time   CHOL  10/12/2010 0430    117        ATP III CLASSIFICATION:  <200     mg/dL   Desirable  200-239  mg/dL   Borderline High  >=240    mg/dL   High          TRIG 82 10/12/2010 0430   HDL 29 (L) 10/12/2010 0430   CHOLHDL 4.0 10/12/2010 0430   VLDL 16 10/12/2010 0430   LDLCALC  10/12/2010 0430    72        Total Cholesterol/HDL:CHD Risk Coronary Heart Disease Risk Table                     Men   Women  1/2 Average Risk   3.4   3.3  Average Risk       5.0   4.4  2 X Average Risk   9.6   7.1  3 X Average Risk  23.4   11.0        Use the calculated Patient Ratio above and the CHD Risk Table to determine the patient's CHD Risk.        ATP III CLASSIFICATION (LDL):  <100     mg/dL   Optimal  100-129  mg/dL   Near or Above                    Optimal  130-159  mg/dL   Borderline  160-189  mg/dL   High  >190     mg/dL   Very High     Other studies Reviewed: Additional studies/ records that were reviewed today with results demonstrating: ***.   ASSESSMENT AND PLAN:  1. CAD: Avoid processed foods. Continue aggressive secondary prevention. 2. Hyperlipidemia: Continue statin therapy.  He has had difficulty with simvastatin and ezetimibe in the  past.  Difficult to get him on high-dose statin. 3. DM: Whole food plant based diet. 4. DAytime sleepiness: Watch for worsening symptoms.  Would consider sleep study if needed. 5. Heart murmur: Likely mild AS.  Have not pursued echocardiogram  in the past due to lack of symptoms.   Current medicines are reviewed at length with the patient today.  The patient concerns regarding his medicines were addressed.  The following changes have been made:  No change***  Labs/ tests ordered today include: *** No orders of the defined types were placed in this encounter.   Recommend 150 minutes/week of aerobic exercise Low fat, low carb, high fiber diet recommended  Disposition:   FU in ***   Signed, Lance Muss, MD  08/11/2020 11:08 AM    [facility] [address] [place], [place], [place]  [address] Phone: [phone]; Fax: [phone]

## 2020-08-12 ENCOUNTER — Ambulatory Visit: Payer: Medicare HMO | Admitting: Interventional Cardiology

## 2020-08-12 DIAGNOSIS — E782 Mixed hyperlipidemia: Secondary | ICD-10-CM

## 2020-08-12 DIAGNOSIS — I2510 Atherosclerotic heart disease of native coronary artery without angina pectoris: Secondary | ICD-10-CM

## 2020-08-12 DIAGNOSIS — I350 Nonrheumatic aortic (valve) stenosis: Secondary | ICD-10-CM

## 2020-08-12 DIAGNOSIS — Z1159 Encounter for screening for other viral diseases: Secondary | ICD-10-CM | POA: Diagnosis not present

## 2020-08-12 DIAGNOSIS — E1159 Type 2 diabetes mellitus with other circulatory complications: Secondary | ICD-10-CM

## 2020-08-14 DIAGNOSIS — D123 Benign neoplasm of transverse colon: Secondary | ICD-10-CM | POA: Diagnosis not present

## 2020-08-14 DIAGNOSIS — D120 Benign neoplasm of cecum: Secondary | ICD-10-CM | POA: Diagnosis not present

## 2020-08-14 DIAGNOSIS — R195 Other fecal abnormalities: Secondary | ICD-10-CM | POA: Diagnosis not present

## 2020-08-14 DIAGNOSIS — K635 Polyp of colon: Secondary | ICD-10-CM | POA: Diagnosis not present

## 2020-08-14 DIAGNOSIS — K5730 Diverticulosis of large intestine without perforation or abscess without bleeding: Secondary | ICD-10-CM | POA: Diagnosis not present

## 2020-08-14 DIAGNOSIS — K621 Rectal polyp: Secondary | ICD-10-CM | POA: Diagnosis not present

## 2020-08-16 DIAGNOSIS — D120 Benign neoplasm of cecum: Secondary | ICD-10-CM | POA: Diagnosis not present

## 2020-08-16 DIAGNOSIS — D123 Benign neoplasm of transverse colon: Secondary | ICD-10-CM | POA: Diagnosis not present

## 2020-08-16 DIAGNOSIS — K621 Rectal polyp: Secondary | ICD-10-CM | POA: Diagnosis not present

## 2020-09-09 NOTE — Progress Notes (Signed)
Cardiology Office Note   Date:  09/11/2020   ID:  Shloime, Keilman [birth year], MRN [id number]  PCP:  Josetta Huddle, MD    No chief complaint on file.  CAD  Wt Readings from Last 3 Encounters:  09/11/20 161 lb (73 kg)  08/09/19 162 lb 3.2 oz (73.6 kg)  08/09/18 164 lb 3.2 oz (74.5 kg)       History of Present Illness: Derrick Edwards is a 75 y.o. male   who had CABG, 3 vessel,in 2012.  He had left knee replacement in 8/15.  In 2018, it was noted that: "Not walking much due to chronic foot pain.  In the past, he was eligible to use [employer], but he has never gone. He can have membership for free.  Wife stated "he won't go."  He still has issues with falling asleep easily when he sits in a recliner."  He has had some right knee pain.  THis has improved.  Denies : Chest pain. Dizziness. Leg edema. Nitroglycerin use. Orthopnea. Palpitations. Paroxysmal nocturnal dyspnea. Shortness of breath. Syncope.   Walking less due to cold weather.  He got his COVID shots.  With the booster, he had some aches and headache.    Past Medical History:  Diagnosis Date  . Arthralgia    with Vytorin and myalgias   . Coronary atherosclerosis of unspecified type of vessel, native or graft    left main CAD, 10/2010-Dr. V and Dr. Cyndia Bent  . Diabetes ([place])   . GERD (gastroesophageal reflux disease)   . History of kidney stones   . History of tobacco use   . Hyperplastic colon polyp    2, 2008, repeat colonoscopy in 2018  . Low back pain    associated with degenerative disc disease   . Mixed hyperlipidemia   . Shoulder pain   . Type II diabetes mellitus ([place])     Past Surgical History:  Procedure Laterality Date  . BACK SURGERY     fusion for fracture verterbre   . CARDIAC CATHETERIZATION  2012  . CORONARY ARTERY BYPASS GRAFT  2012  . KIDNEY STONE SURGERY  x 3  . TOTAL KNEE ARTHROPLASTY Left 04/03/2014   DR DALLDORF  . TOTAL KNEE ARTHROPLASTY Left 04/03/2014   Procedure:  TOTAL KNEE ARTHROPLASTY;  Surgeon: Hessie Dibble, MD;  Location: [facility];  Service: Orthopedics;  Laterality: Left;     Current Outpatient Medications  Medication Sig Dispense Refill  . aspirin EC 81 MG tablet Take 1 tablet (81 mg total) by mouth daily. 90 tablet 3  . cholecalciferol (VITAMIN D) 1000 UNITS tablet Take 1,000 Units by mouth daily.    . Coenzyme Q10 (COQ10) 100 MG CAPS Take 100 mg by mouth daily.    Marland Kitchen lisinopril (PRINIVIL,ZESTRIL) 2.5 MG tablet Take 2.5 mg by mouth daily.    . metFORMIN (GLUCOPHAGE-XR) 500 MG 24 hr tablet Take 500 mg by mouth 3 (three) times daily.     . metoprolol tartrate (LOPRESSOR) 25 MG tablet Take 0.5 tablets (12.5 mg total) by mouth 2 (two) times daily. 90 tablet 3  . nitroGLYCERIN (NITROSTAT) 0.4 MG SL tablet Place 0.4 mg under the tongue every 5 (five) minutes as needed for chest pain.    . Omega-3 Fatty Acids (FISH OIL) 1000 MG CAPS Take 1,000 mg by mouth daily.    . ONE TOUCH ULTRA TEST test strip 1 each by Other route as needed for other.     Marland Kitchen  tamsulosin (FLOMAX) 0.4 MG CAPS capsule Take 0.4 mg by mouth daily after supper.    Marland Kitchen atorvastatin (LIPITOR) 10 MG tablet Take 10 mg by mouth daily.    . rosuvastatin (CRESTOR) 5 MG tablet Take 5 mg by mouth daily at 6 PM.      No current facility-administered medications for this visit.    Allergies:   Ezetimibe-simvastatin    Social History:  The patient  reports that he quit smoking about 37 years ago. His smoking use included cigarettes. He has a 20.00 pack-year smoking history. He has never used smokeless tobacco. He reports that he does not drink alcohol and does not use drugs.   Family History:  The patient's family history includes Heart attack in his brother; Heart disease in his father; Stroke in his father.    ROS:  Please see the history of present illness.   Otherwise, review of systems are positive for .   All other systems are reviewed and negative.    PHYSICAL EXAM: VS:  BP (!)  120/52   Pulse (!) 56   Ht 5\' 7"  (1.702 m)   Wt 161 lb (73 kg)   SpO2 97%   BMI 25.22 kg/m  , BMI Body mass index is 25.22 kg/m. GEN: Well nourished, well developed, in no acute distress  HEENT: normal  Neck: no JVD, carotid bruits, or masses Cardiac: RRR; 3/6 early systolic murmurs, rubs, or gallops,no edema  Respiratory:  clear to auscultation bilaterally, normal work of breathing GI: soft, nontender, nondistended, + BS MS: no deformity or atrophy  Skin: warm and dry, no rash Neuro:  Strength and sensation are intact Psych: euthymic mood, full affect   EKG:   The ekg ordered today demonstrates sinus brady, PR prolongation, Wenkebach pattern   Recent Labs: No results found for requested labs within last 8760 hours.   Lipid Panel    Component Value Date/Time   CHOL  10/12/2010 0430    117        ATP III CLASSIFICATION:  <200     mg/dL   Desirable  200-239  mg/dL   Borderline High  >=240    mg/dL   High          TRIG 82 10/12/2010 0430   HDL 29 (L) 10/12/2010 0430   CHOLHDL 4.0 10/12/2010 0430   VLDL 16 10/12/2010 0430   LDLCALC  10/12/2010 0430    72        Total Cholesterol/HDL:CHD Risk Coronary Heart Disease Risk Table                     Men   Women  1/2 Average Risk   3.4   3.3  Average Risk       5.0   4.4  2 X Average Risk   9.6   7.1  3 X Average Risk  23.4   11.0        Use the calculated Patient Ratio above and the CHD Risk Table to determine the patient's CHD Risk.        ATP III CLASSIFICATION (LDL):  <100     mg/dL   Optimal  100-129  mg/dL   Near or Above                    Optimal  130-159  mg/dL   Borderline  160-189  mg/dL   High  >190     mg/dL   Very High  Other studies Reviewed: Additional studies/ records that were reviewed today with results demonstrating: .   ASSESSMENT AND PLAN:  1. CAD: Continue aggressive secondary prevention.  No angina.  Stop metoprolol.  Sinus brady with prolonged PR.  2. Hyperlipidemia: Avoid  processed foods.  Continue lipid-lowering therapy.  Whole food, plant-based diet. 3. DM: Whole food, plant-based diet.  a1c 7.2. 4. Presumed Aortic stenosis: Likely mild based on exam.  Check echo. Mild dizziness with standing.  Stay better hydrated.  Decrease diet coke intake.  5. We have discussed sleep testing in the past.  Watch for daytime somnolence that is more limiting.   Current medicines are reviewed at length with the patient today.  The patient concerns regarding his medicines were addressed.  The following changes have been made:  No change  Labs/ tests ordered today include:  No orders of the defined types were placed in this encounter.   Recommend 150 minutes/week of aerobic exercise Low fat, low carb, high fiber diet recommended  Disposition:   FU in 1 year   Signed, Larae Grooms, MD  09/11/2020 9:28 AM    [facility] [place], [place], [place]  [address] Phone: [phone]; Fax: [phone]

## 2020-09-11 ENCOUNTER — Other Ambulatory Visit: Payer: Self-pay

## 2020-09-11 ENCOUNTER — Encounter: Payer: Self-pay | Admitting: Interventional Cardiology

## 2020-09-11 ENCOUNTER — Ambulatory Visit: Payer: Medicare HMO | Admitting: Interventional Cardiology

## 2020-09-11 VITALS — BP 120/52 | HR 56 | Ht 67.0 in | Wt 161.0 lb

## 2020-09-11 DIAGNOSIS — E782 Mixed hyperlipidemia: Secondary | ICD-10-CM | POA: Diagnosis not present

## 2020-09-11 DIAGNOSIS — I2510 Atherosclerotic heart disease of native coronary artery without angina pectoris: Secondary | ICD-10-CM | POA: Diagnosis not present

## 2020-09-11 DIAGNOSIS — I350 Nonrheumatic aortic (valve) stenosis: Secondary | ICD-10-CM | POA: Diagnosis not present

## 2020-09-11 DIAGNOSIS — I252 Old myocardial infarction: Secondary | ICD-10-CM

## 2020-09-11 DIAGNOSIS — E1159 Type 2 diabetes mellitus with other circulatory complications: Secondary | ICD-10-CM

## 2020-09-11 NOTE — Patient Instructions (Signed)
Medication Instructions:  Your physician has recommended you make the following change in your medication:   1. STOP: metoprolol  2. Check to see if you are taking atorvastatin (lipitor) or rosuvastatin (crestor) and call us and let us know   *If you need a refill on your cardiac medications before your next appointment, please call your pharmacy*   Lab Work: None  If you have labs (blood work) drawn today and your tests are completely normal, you will receive your results only by: Marland Kitchen MyChart Message (if you have MyChart) OR . A paper copy in the mail If you have any lab test that is abnormal or we need to change your treatment, we will call you to review the results.   Testing/Procedures: Your physician has requested that you have an echocardiogram. Echocardiography is a painless test that uses sound waves to create images of your heart. It provides your doctor with information about the size and shape of your heart and how well your heart's chambers and valves are working. This procedure takes approximately one hour. There are no restrictions for this procedure.   Follow-Up: At [facility], you and your health needs are our priority.  As part of our continuing mission to provide you with exceptional heart care, we have created designated Provider Care Teams.  These Care Teams include your primary Cardiologist (physician) and Advanced Practice Providers (APPs -  Physician Assistants and Nurse Practitioners) who all work together to provide you with the care you need, when you need it.  We recommend signing up for the patient portal called "MyChart".  Sign up information is provided on this After Visit Summary.  MyChart is used to connect with patients for Virtual Visits (Telemedicine).  Patients are able to view lab/test results, encounter notes, upcoming appointments, etc.  Non-urgent messages can be sent to your provider as well.   To learn more about what you can do with MyChart, go  to [email or web address].    Your next appointment:   12 month(s)  The format for your next appointment:   In Person  Provider:   You may see Larae Grooms, MD or one of the following Advanced Practice Providers on your designated Care Team:    Melina Copa, PA-C  Ermalinda Barrios, PA-C    Other Instructions Decrease soda intake   High-Fiber Eating Plan Fiber, also called dietary fiber, is a type of carbohydrate. It is found foods such as fruits, vegetables, whole grains, and beans. A high-fiber diet can have many health benefits. Your health care provider may recommend a high-fiber diet to help:  Prevent constipation. Fiber can make your bowel movements more regular.  Lower your cholesterol.  Relieve the following conditions: ? Inflammation of veins in the anus (hemorrhoids). ? Inflammation of specific areas of the digestive tract (uncomplicated diverticulosis). ? A problem of the large intestine, also called the colon, that sometimes causes pain and diarrhea (irritable bowel syndrome, or IBS).  Prevent overeating as part of a weight-loss plan.  Prevent heart disease, type 2 diabetes, and certain cancers. What are tips for following this plan? Reading food labels  Check the nutrition facts label on food products for the amount of dietary fiber. Choose foods that have 5 grams of fiber or more per serving.  The goals for recommended daily fiber intake include: ? Men (age 53 or younger): 34-38 g. ? Men (over age 38): 28-34 g. ? Women (age 58 or younger): 25-28 g. ? Women (over age 61): 22-25 g.  Your daily fiber goal is _____________ g.   Shopping  Choose whole fruits and vegetables instead of processed forms, such as apple juice or applesauce.  Choose a wide variety of high-fiber foods such as avocados, lentils, oats, and kidney beans.  Read the nutrition facts label of the foods you choose. Be aware of foods with added fiber. These foods often have high sugar and  sodium amounts per serving. Cooking  Use whole-grain flour for baking and cooking.  Cook with brown rice instead of white rice. Meal planning  Start the day with a breakfast that is high in fiber, such as a cereal that contains 5 g of fiber or more per serving.  Eat breads and cereals that are made with whole-grain flour instead of refined flour or white flour.  Eat brown rice, bulgur wheat, or millet instead of white rice.  Use beans in place of meat in soups, salads, and pasta dishes.  Be sure that half of the grains you eat each day are whole grains. General information  You can get the recommended daily intake of dietary fiber by: ? Eating a variety of fruits, vegetables, grains, nuts, and beans. ? Taking a fiber supplement if you are not able to take in enough fiber in your diet. It is better to get fiber through food than from a supplement.  Gradually increase how much fiber you consume. If you increase your intake of dietary fiber too quickly, you may have bloating, cramping, or gas.  Drink plenty of water to help you digest fiber.  Choose high-fiber snacks, such as berries, raw vegetables, nuts, and popcorn. What foods should I eat? Fruits Berries. Pears. Apples. Oranges. Avocado. Prunes and raisins. Dried figs. Vegetables Sweet potatoes. Spinach. Kale. Artichokes. Cabbage. Broccoli. Cauliflower. Green peas. Carrots. Squash. Grains Whole-grain breads. Multigrain cereal. Oats and oatmeal. Brown rice. Barley. Bulgur wheat. [place]. Quinoa. Bran muffins. Popcorn. Rye wafer crackers. Meats and other proteins Navy beans, kidney beans, and pinto beans. Soybeans. Split peas. Lentils. Nuts and seeds. Dairy Fiber-fortified yogurt. Beverages Fiber-fortified soy milk. Fiber-fortified orange juice. Other foods Fiber bars. The items listed above may not be a complete list of recommended foods and beverages. Contact a dietitian for more information. What foods should I  avoid? Fruits Fruit juice. Cooked, strained fruit. Vegetables Fried potatoes. Canned vegetables. Well-cooked vegetables. Grains White bread. Pasta made with refined flour. White rice. Meats and other proteins Fatty cuts of meat. Fried chicken or fried fish. Dairy Milk. Yogurt. Cream cheese. Sour cream. Fats and oils Butters. Beverages Soft drinks. Other foods Cakes and pastries. The items listed above may not be a complete list of foods and beverages to avoid. Talk with your dietitian about what choices are best for you. Summary  Fiber is a type of carbohydrate. It is found in foods such as fruits, vegetables, whole grains, and beans.  A high-fiber diet has many benefits. It can help to prevent constipation, lower blood cholesterol, aid weight loss, and reduce your risk of heart disease, diabetes, and certain cancers.  Increase your intake of fiber gradually. Increasing fiber too quickly may cause cramping, bloating, and gas. Drink plenty of water while you increase the amount of fiber you consume.  The best sources of fiber include whole fruits and vegetables, whole grains, nuts, seeds, and beans. This information is not intended to replace advice given to you by your health care provider. Make sure you discuss any questions you have with your health care provider. Document Revised: 11/30/2019 Document  Reviewed: 11/30/2019 Elsevier Patient Education  [place].

## 2020-09-25 DIAGNOSIS — N400 Enlarged prostate without lower urinary tract symptoms: Secondary | ICD-10-CM | POA: Diagnosis not present

## 2020-09-25 DIAGNOSIS — E782 Mixed hyperlipidemia: Secondary | ICD-10-CM | POA: Diagnosis not present

## 2020-09-25 DIAGNOSIS — K219 Gastro-esophageal reflux disease without esophagitis: Secondary | ICD-10-CM | POA: Diagnosis not present

## 2020-09-25 DIAGNOSIS — I2510 Atherosclerotic heart disease of native coronary artery without angina pectoris: Secondary | ICD-10-CM | POA: Diagnosis not present

## 2020-09-25 DIAGNOSIS — N132 Hydronephrosis with renal and ureteral calculous obstruction: Secondary | ICD-10-CM | POA: Diagnosis not present

## 2020-09-25 DIAGNOSIS — E1165 Type 2 diabetes mellitus with hyperglycemia: Secondary | ICD-10-CM | POA: Diagnosis not present

## 2020-09-25 DIAGNOSIS — N401 Enlarged prostate with lower urinary tract symptoms: Secondary | ICD-10-CM | POA: Diagnosis not present

## 2020-10-03 ENCOUNTER — Other Ambulatory Visit: Payer: Self-pay

## 2020-10-03 ENCOUNTER — Ambulatory Visit (HOSPITAL_COMMUNITY): Payer: Medicare HMO | Attending: Cardiology

## 2020-10-03 DIAGNOSIS — I350 Nonrheumatic aortic (valve) stenosis: Secondary | ICD-10-CM | POA: Insufficient documentation

## 2020-10-03 LAB — ECHOCARDIOGRAM COMPLETE
AR max vel: 1.49 cm2
AV Area VTI: 1.48 cm2
AV Area mean vel: 1.57 cm2
AV Mean grad: 10 mmHg
AV Peak grad: 16.3 mmHg
Ao pk vel: 2.02 m/s
Area-P 1/2: 2.87 cm2
S' Lateral: 2.9 cm

## 2020-12-30 DIAGNOSIS — N401 Enlarged prostate with lower urinary tract symptoms: Secondary | ICD-10-CM | POA: Diagnosis not present

## 2020-12-30 DIAGNOSIS — E782 Mixed hyperlipidemia: Secondary | ICD-10-CM | POA: Diagnosis not present

## 2020-12-30 DIAGNOSIS — N132 Hydronephrosis with renal and ureteral calculous obstruction: Secondary | ICD-10-CM | POA: Diagnosis not present

## 2020-12-30 DIAGNOSIS — I2510 Atherosclerotic heart disease of native coronary artery without angina pectoris: Secondary | ICD-10-CM | POA: Diagnosis not present

## 2020-12-30 DIAGNOSIS — K219 Gastro-esophageal reflux disease without esophagitis: Secondary | ICD-10-CM | POA: Diagnosis not present

## 2020-12-30 DIAGNOSIS — E1165 Type 2 diabetes mellitus with hyperglycemia: Secondary | ICD-10-CM | POA: Diagnosis not present

## 2020-12-30 DIAGNOSIS — N400 Enlarged prostate without lower urinary tract symptoms: Secondary | ICD-10-CM | POA: Diagnosis not present

## 2021-02-06 DIAGNOSIS — Z0000 Encounter for general adult medical examination without abnormal findings: Secondary | ICD-10-CM | POA: Diagnosis not present

## 2021-02-06 DIAGNOSIS — N401 Enlarged prostate with lower urinary tract symptoms: Secondary | ICD-10-CM | POA: Diagnosis not present

## 2021-02-06 DIAGNOSIS — Z7984 Long term (current) use of oral hypoglycemic drugs: Secondary | ICD-10-CM | POA: Diagnosis not present

## 2021-02-06 DIAGNOSIS — E1165 Type 2 diabetes mellitus with hyperglycemia: Secondary | ICD-10-CM | POA: Diagnosis not present

## 2021-02-06 DIAGNOSIS — K219 Gastro-esophageal reflux disease without esophagitis: Secondary | ICD-10-CM | POA: Diagnosis not present

## 2021-02-06 DIAGNOSIS — E559 Vitamin D deficiency, unspecified: Secondary | ICD-10-CM | POA: Diagnosis not present

## 2021-02-06 DIAGNOSIS — Z1389 Encounter for screening for other disorder: Secondary | ICD-10-CM | POA: Diagnosis not present

## 2021-02-06 DIAGNOSIS — Z79899 Other long term (current) drug therapy: Secondary | ICD-10-CM | POA: Diagnosis not present

## 2021-02-06 DIAGNOSIS — I700 Atherosclerosis of aorta: Secondary | ICD-10-CM | POA: Diagnosis not present

## 2021-02-06 DIAGNOSIS — E782 Mixed hyperlipidemia: Secondary | ICD-10-CM | POA: Diagnosis not present

## 2021-02-06 DIAGNOSIS — I2510 Atherosclerotic heart disease of native coronary artery without angina pectoris: Secondary | ICD-10-CM | POA: Diagnosis not present

## 2021-02-06 DIAGNOSIS — M109 Gout, unspecified: Secondary | ICD-10-CM | POA: Diagnosis not present

## 2021-02-21 DIAGNOSIS — I2510 Atherosclerotic heart disease of native coronary artery without angina pectoris: Secondary | ICD-10-CM | POA: Diagnosis not present

## 2021-02-21 DIAGNOSIS — E782 Mixed hyperlipidemia: Secondary | ICD-10-CM | POA: Diagnosis not present

## 2021-02-21 DIAGNOSIS — K219 Gastro-esophageal reflux disease without esophagitis: Secondary | ICD-10-CM | POA: Diagnosis not present

## 2021-02-21 DIAGNOSIS — N132 Hydronephrosis with renal and ureteral calculous obstruction: Secondary | ICD-10-CM | POA: Diagnosis not present

## 2021-02-21 DIAGNOSIS — N401 Enlarged prostate with lower urinary tract symptoms: Secondary | ICD-10-CM | POA: Diagnosis not present

## 2021-02-21 DIAGNOSIS — E1165 Type 2 diabetes mellitus with hyperglycemia: Secondary | ICD-10-CM | POA: Diagnosis not present

## 2021-10-12 NOTE — Progress Notes (Addendum)
Cardiology Office Note   Date:  10/13/2021   ID:  [name], [name] 01/04/1946, MRN [id number]  PCP:  Josetta Huddle, MD    No chief complaint on file.  CAD  Wt Readings from Last 3 Encounters:  10/13/21 159 lb (72.1 kg)  09/11/20 161 lb (73 kg)  08/09/19 162 lb 3.2 oz (73.6 kg)       History of Present Illness: Derrick Edwards is a 76 y.o. male  who had CABG, 3 vessel,  in 2012.   He had left knee replacement in 8/15.   In 2018, it was noted that: "Not walking much due to chronic foot pain.    In the past, he was eligible to use the [employer], but he has never gone.  He can have membership for free.  Wife stated "he won't go."   He still has issues with falling asleep easily when he sits in a recliner."   He has had some right knee pain.   Echo in 2022: "Left ventricular ejection fraction, by estimation, is 55 to 60%. The  left ventricle has normal function. The left ventricle has no regional  wall motion abnormalities. Left ventricular diastolic parameters are  indeterminate.   2. Right ventricular systolic function is mildly reduced. The right  ventricular size is normal. Tricuspid regurgitation signal is inadequate  for assessing PA pressure.   3. Left atrial size was mildly dilated.   4. The mitral valve is normal in structure. No evidence of mitral valve  regurgitation. No evidence of mitral stenosis.   5. The aortic valve is tricuspid. Aortic valve regurgitation is not  visualized. Mild aortic valve stenosis. Aortic valve mean gradient  measures 10.0 mmHg.   6. The inferior vena cava is normal in size with greater than 50%  respiratory variability, suggesting right atrial pressure of 3 mmHg. "  Denies : Chest pain. Dizziness. Leg edema. Nitroglycerin use. Orthopnea. Palpitations. Paroxysmal nocturnal dyspnea. Shortness of breath. Syncope.   Not walking to the target noted below.   Past Medical History:  Diagnosis Date   Arthralgia    with Vytorin and  myalgias    Coronary atherosclerosis of unspecified type of vessel, native or graft    left main CAD, 10/2010-Dr. V and Dr. Cyndia Bent   [facility])    GERD (gastroesophageal reflux disease)    History of kidney stones    History of tobacco use    Hyperplastic colon polyp    2, 2008, repeat colonoscopy in 2018   Low back pain    associated with degenerative disc disease    Mixed hyperlipidemia    Shoulder pain    Type II diabetes mellitus ([place])     Past Surgical History:  Procedure Laterality Date   BACK SURGERY     fusion for fracture verterbre    CARDIAC CATHETERIZATION  2012   CORONARY ARTERY BYPASS GRAFT  2012   KIDNEY STONE SURGERY  x 3   TOTAL KNEE ARTHROPLASTY Left 04/03/2014   DR DALLDORF   TOTAL KNEE ARTHROPLASTY Left 04/03/2014   Procedure: TOTAL KNEE ARTHROPLASTY;  Surgeon: Hessie Dibble, MD;  Location: [place];  Service: Orthopedics;  Laterality: Left;     Current Outpatient Medications  Medication Sig Dispense Refill   aspirin EC 81 MG tablet Take 1 tablet (81 mg total) by mouth daily. 90 tablet 3   atorvastatin (LIPITOR) 10 MG tablet Take 10 mg by mouth daily.     cholecalciferol (VITAMIN  D) 1000 UNITS tablet Take 1,000 Units by mouth daily.     Coenzyme Q10 (COQ10) 100 MG CAPS Take 100 mg by mouth daily.     empagliflozin (JARDIANCE) 25 MG TABS tablet Take 12.5 mg by mouth daily.     lisinopril (PRINIVIL,ZESTRIL) 2.5 MG tablet Take 2.5 mg by mouth daily.     nitroGLYCERIN (NITROSTAT) 0.4 MG SL tablet Place 0.4 mg under the tongue every 5 (five) minutes as needed for chest pain.     Omega-3 Fatty Acids (FISH OIL) 1000 MG CAPS Take 1,000 mg by mouth daily.     ONE TOUCH ULTRA TEST test strip 1 each by Other route as needed for other.      rosuvastatin (CRESTOR) 5 MG tablet Take 5 mg by mouth daily at 6 PM.      tamsulosin (FLOMAX) 0.4 MG CAPS capsule Take 0.4 mg by mouth daily after supper.     metFORMIN (GLUCOPHAGE-XR) 500 MG 24 hr tablet Take 500 mg by mouth 3  (three) times daily.  (Patient not taking: Reported on 10/13/2021)     No current facility-administered medications for this visit.    Allergies:   Ezetimibe-simvastatin    Social History:  The patient  reports that he quit smoking about 38 years ago. His smoking use included cigarettes. He has a 20.00 pack-year smoking history. He has never used smokeless tobacco. He reports that he does not drink alcohol and does not use drugs.   Family History:  The patient's family history includes Heart attack in his brother; Heart disease in his father; Stroke in his father.    ROS:  Please see the history of present illness.   Otherwise, review of systems are positive for snoring.   All other systems are reviewed and negative.    PHYSICAL EXAM: VS:  BP (!) 108/58    Pulse 76    Ht '5\' 7"'$  (1.702 m)    Wt 159 lb (72.1 kg)    SpO2 96%    BMI 24.90 kg/m  , BMI Body mass index is 24.9 kg/m. GEN: Well nourished, well developed, in no acute distress HEENT: normal Neck: no JVD, carotid bruits, or masses Cardiac: RRR; no murmurs, rubs, or gallops,no edema  Respiratory:  clear to auscultation bilaterally, normal work of breathing GI: soft, nontender, nondistended, + BS MS: no deformity or atrophy Skin: warm and dry, no rash Neuro:  Strength and sensation are intact Psych: euthymic mood, full affect   EKG:   The ekg ordered today demonstrates NSR; decreased R wave progression and slightly widened QRS compared to prior.  Prolonged PR interval   Recent Labs: No results found for requested labs within last 8760 hours.   Lipid Panel    Component Value Date/Time   CHOL  10/12/2010 0430    117        ATP III CLASSIFICATION:  <200     mg/dL   Desirable  200-239  mg/dL   Borderline High  >=240    mg/dL   High          TRIG 82 10/12/2010 0430   HDL 29 (L) 10/12/2010 0430   CHOLHDL 4.0 10/12/2010 0430   VLDL 16 10/12/2010 0430   LDLCALC  10/12/2010 0430    72        Total Cholesterol/HDL:CHD  Risk Coronary Heart Disease Risk Table                     Men  Women  1/2 Average Risk   3.4   3.3  Average Risk       5.0   4.4  2 X Average Risk   9.6   7.1  3 X Average Risk  23.4   11.0        Use the calculated Patient Ratio above and the CHD Risk Table to determine the patient's CHD Risk.        ATP III CLASSIFICATION (LDL):  <100     mg/dL   Optimal  100-129  mg/dL   Near or Above                    Optimal  130-159  mg/dL   Borderline  160-189  mg/dL   High  >190     mg/dL   Very High     Other studies Reviewed: Additional studies/ records that were reviewed today with results demonstrating: LDL 69, triglycerides 98, HDL 35,  Total cholesterol 122 in June 2022.  Creatinine 1.04 in 2021.     ASSESSMENT AND PLAN:  CAD: Continue aggressive secondary prevention.  Continue aspirin.  No angina on medical therapy.  No bleeding problems.  There is a change in his ECG.  QRS has widened slightly and R wave progression is decreased when compared to 2020 and 2019 ECG.  We will check echocardiogram to ensure that LV function is intact.  ECG changes could just be progression of conduction disease.  No symptoms of bradycardia.  Try to increase exercise to target below. Hyperlipidemia: Whole food, plant-based diet recommended. DM: High-fiber diet.  A1c 7.4 in June 2022.  Has a treadmill but not used.  Blood work followed at the [place]- last blood test in 06/2021. Aortic stenosis: No symptoms of dizziness, fluid overload, syncope.  Noted to be mild in 2022. Daytime sleepiness: Never had a sleep study.  Wife reports some snoring.  He is not interested in sleep study at this time.   Current medicines are reviewed at length with the patient today.  The patient concerns regarding his medicines were addressed.  The following changes have been made:  No change  Labs/ tests ordered today include: Echo  Orders Placed This Encounter  Procedures   EKG 12-Lead   ECHOCARDIOGRAM COMPLETE     Recommend 150 minutes/week of aerobic exercise Low fat, low carb, high fiber diet recommended  Disposition:   FU in 1 year or sooner if significant abnormality on echo   Signed, Larae Grooms, MD  10/13/2021 8:57 AM    [facility] [place], [place], [place]  [address] Phone: [phone]; Fax: [phone]

## 2021-10-13 ENCOUNTER — Encounter: Payer: Self-pay | Admitting: Interventional Cardiology

## 2021-10-13 ENCOUNTER — Other Ambulatory Visit: Payer: Self-pay

## 2021-10-13 ENCOUNTER — Ambulatory Visit: Payer: Medicare HMO | Admitting: Interventional Cardiology

## 2021-10-13 VITALS — BP 108/58 | HR 76 | Ht 67.0 in | Wt 159.0 lb

## 2021-10-13 DIAGNOSIS — I252 Old myocardial infarction: Secondary | ICD-10-CM | POA: Diagnosis not present

## 2021-10-13 DIAGNOSIS — E1159 Type 2 diabetes mellitus with other circulatory complications: Secondary | ICD-10-CM

## 2021-10-13 DIAGNOSIS — I2510 Atherosclerotic heart disease of native coronary artery without angina pectoris: Secondary | ICD-10-CM

## 2021-10-13 DIAGNOSIS — E782 Mixed hyperlipidemia: Secondary | ICD-10-CM | POA: Diagnosis not present

## 2021-10-13 DIAGNOSIS — I350 Nonrheumatic aortic (valve) stenosis: Secondary | ICD-10-CM

## 2021-10-13 NOTE — Patient Instructions (Signed)
Medication Instructions:  ?Your physician recommends that you continue on your current medications as directed. Please refer to the Current Medication list given to you today. ? ?*If you need a refill on your cardiac medications before your next appointment, please call your pharmacy* ? ? ?Lab Work: ?none ?If you have labs (blood work) drawn today and your tests are completely normal, you will receive your results only by: ?MyChart Message (if you have MyChart) OR ?A paper copy in the mail ?If you have any lab test that is abnormal or we need to change your treatment, we will call you to review the results. ? ? ?Testing/Procedures: ?Your physician has requested that you have an echocardiogram. Echocardiography is a painless test that uses sound waves to create images of your heart. It provides your doctor with information about the size and shape of your heart and how well your heart?s chambers and valves are working. This procedure takes approximately one hour. There are no restrictions for this procedure. ? ? ? ?Follow-Up: ?At [facility], you and your health needs are our priority.  As part of our continuing mission to provide you with exceptional heart care, we have created designated Provider Care Teams.  These Care Teams include your primary Cardiologist (physician) and Advanced Practice Providers (APPs -  Physician Assistants and Nurse Practitioners) who all work together to provide you with the care you need, when you need it. ? ?We recommend signing up for the patient portal called "MyChart".  Sign up information is provided on this After Visit Summary.  MyChart is used to connect with patients for Virtual Visits (Telemedicine).  Patients are able to view lab/test results, encounter notes, upcoming appointments, etc.  Non-urgent messages can be sent to your provider as well.   ?To learn more about what you can do with MyChart, go to [email or web address].   ? ?Your next appointment:   ?12  month(s) ? ?The format for your next appointment:   ?In Person ? ?Provider:   ?Larae Grooms, MD   ? ? ?Other Instructions ? ? ?

## 2021-10-27 ENCOUNTER — Other Ambulatory Visit: Payer: Self-pay

## 2021-10-27 ENCOUNTER — Ambulatory Visit (HOSPITAL_COMMUNITY): Payer: Medicare HMO | Attending: Cardiology

## 2021-10-27 DIAGNOSIS — I2510 Atherosclerotic heart disease of native coronary artery without angina pectoris: Secondary | ICD-10-CM | POA: Insufficient documentation

## 2021-10-27 DIAGNOSIS — I350 Nonrheumatic aortic (valve) stenosis: Secondary | ICD-10-CM | POA: Insufficient documentation

## 2021-10-27 LAB — ECHOCARDIOGRAM COMPLETE
AR max vel: 1.1 cm2
AV Area VTI: 1.12 cm2
AV Area mean vel: 1.1 cm2
AV Mean grad: 10 mmHg
AV Peak grad: 17.6 mmHg
Ao pk vel: 2.1 m/s
Area-P 1/2: 4.29 cm2
S' Lateral: 3.4 cm

## 2022-02-12 DIAGNOSIS — N401 Enlarged prostate with lower urinary tract symptoms: Secondary | ICD-10-CM | POA: Diagnosis not present

## 2022-02-12 DIAGNOSIS — Z131 Encounter for screening for diabetes mellitus: Secondary | ICD-10-CM | POA: Diagnosis not present

## 2022-02-12 DIAGNOSIS — Z0000 Encounter for general adult medical examination without abnormal findings: Secondary | ICD-10-CM | POA: Diagnosis not present

## 2022-02-12 DIAGNOSIS — N503 Cyst of epididymis: Secondary | ICD-10-CM | POA: Diagnosis not present

## 2022-02-12 DIAGNOSIS — E782 Mixed hyperlipidemia: Secondary | ICD-10-CM | POA: Diagnosis not present

## 2022-02-12 DIAGNOSIS — Z79899 Other long term (current) drug therapy: Secondary | ICD-10-CM | POA: Diagnosis not present

## 2022-02-12 DIAGNOSIS — I700 Atherosclerosis of aorta: Secondary | ICD-10-CM | POA: Diagnosis not present

## 2022-02-12 DIAGNOSIS — M109 Gout, unspecified: Secondary | ICD-10-CM | POA: Diagnosis not present

## 2022-02-12 DIAGNOSIS — E1165 Type 2 diabetes mellitus with hyperglycemia: Secondary | ICD-10-CM | POA: Diagnosis not present

## 2022-02-12 DIAGNOSIS — K219 Gastro-esophageal reflux disease without esophagitis: Secondary | ICD-10-CM | POA: Diagnosis not present

## 2022-02-12 DIAGNOSIS — M720 Palmar fascial fibromatosis [Dupuytren]: Secondary | ICD-10-CM | POA: Diagnosis not present

## 2022-02-12 DIAGNOSIS — I2510 Atherosclerotic heart disease of native coronary artery without angina pectoris: Secondary | ICD-10-CM | POA: Diagnosis not present

## 2022-02-12 DIAGNOSIS — E559 Vitamin D deficiency, unspecified: Secondary | ICD-10-CM | POA: Diagnosis not present

## 2022-02-25 DIAGNOSIS — M720 Palmar fascial fibromatosis [Dupuytren]: Secondary | ICD-10-CM | POA: Diagnosis not present

## 2022-07-13 ENCOUNTER — Emergency Department (HOSPITAL_BASED_OUTPATIENT_CLINIC_OR_DEPARTMENT_OTHER)
Admission: EM | Admit: 2022-07-13 | Discharge: 2022-07-13 | Disposition: A | Discharge status: Home / Self Care | Payer: Medicare HMO | Attending: Emergency Medicine | Admitting: Emergency Medicine

## 2022-07-13 ENCOUNTER — Other Ambulatory Visit: Payer: Self-pay

## 2022-07-13 ENCOUNTER — Emergency Department (HOSPITAL_BASED_OUTPATIENT_CLINIC_OR_DEPARTMENT_OTHER): Payer: Medicare HMO

## 2022-07-13 ENCOUNTER — Encounter (HOSPITAL_BASED_OUTPATIENT_CLINIC_OR_DEPARTMENT_OTHER): Payer: Self-pay

## 2022-07-13 DIAGNOSIS — E119 Type 2 diabetes mellitus without complications: Secondary | ICD-10-CM | POA: Insufficient documentation

## 2022-07-13 DIAGNOSIS — Z7982 Long term (current) use of aspirin: Secondary | ICD-10-CM | POA: Diagnosis not present

## 2022-07-13 DIAGNOSIS — E860 Dehydration: Secondary | ICD-10-CM | POA: Insufficient documentation

## 2022-07-13 DIAGNOSIS — Z7984 Long term (current) use of oral hypoglycemic drugs: Secondary | ICD-10-CM | POA: Diagnosis not present

## 2022-07-13 DIAGNOSIS — W19XXXA Unspecified fall, initial encounter: Secondary | ICD-10-CM | POA: Diagnosis not present

## 2022-07-13 DIAGNOSIS — D72829 Elevated white blood cell count, unspecified: Secondary | ICD-10-CM | POA: Diagnosis not present

## 2022-07-13 DIAGNOSIS — R509 Fever, unspecified: Secondary | ICD-10-CM | POA: Diagnosis not present

## 2022-07-13 DIAGNOSIS — Z79899 Other long term (current) drug therapy: Secondary | ICD-10-CM | POA: Insufficient documentation

## 2022-07-13 DIAGNOSIS — I2510 Atherosclerotic heart disease of native coronary artery without angina pectoris: Secondary | ICD-10-CM | POA: Insufficient documentation

## 2022-07-13 DIAGNOSIS — J069 Acute upper respiratory infection, unspecified: Secondary | ICD-10-CM | POA: Diagnosis not present

## 2022-07-13 DIAGNOSIS — R42 Dizziness and giddiness: Secondary | ICD-10-CM

## 2022-07-13 DIAGNOSIS — N179 Acute kidney failure, unspecified: Secondary | ICD-10-CM | POA: Diagnosis not present

## 2022-07-13 DIAGNOSIS — R059 Cough, unspecified: Secondary | ICD-10-CM | POA: Diagnosis not present

## 2022-07-13 DIAGNOSIS — Z1152 Encounter for screening for COVID-19: Secondary | ICD-10-CM | POA: Diagnosis not present

## 2022-07-13 LAB — CBC WITH DIFFERENTIAL/PLATELET
Abs Immature Granulocytes: 0.03 10*3/uL (ref 0.00–0.07)
Basophils Absolute: 0.1 10*3/uL (ref 0.0–0.1)
Basophils Relative: 1 %
Eosinophils Absolute: 0.2 10*3/uL (ref 0.0–0.5)
Eosinophils Relative: 2 %
HCT: 41 % (ref 39.0–52.0)
Hemoglobin: 13.6 g/dL (ref 13.0–17.0)
Immature Granulocytes: 0 %
Lymphocytes Relative: 13 %
Lymphs Abs: 1.4 10*3/uL (ref 0.7–4.0)
MCH: 30.5 pg (ref 26.0–34.0)
MCHC: 33.2 g/dL (ref 30.0–36.0)
MCV: 91.9 fL (ref 80.0–100.0)
Monocytes Absolute: 1.6 10*3/uL — ABNORMAL HIGH (ref 0.1–1.0)
Monocytes Relative: 15 %
Neutro Abs: 7.5 10*3/uL (ref 1.7–7.7)
Neutrophils Relative %: 69 %
Platelets: 159 10*3/uL (ref 150–400)
RBC: 4.46 MIL/uL (ref 4.22–5.81)
RDW: 12.7 % (ref 11.5–15.5)
WBC: 10.8 10*3/uL — ABNORMAL HIGH (ref 4.0–10.5)
nRBC: 0 % (ref 0.0–0.2)

## 2022-07-13 LAB — RESP PANEL BY RT-PCR (FLU A&B, COVID) ARPGX2
Influenza A by PCR: NEGATIVE
Influenza B by PCR: NEGATIVE
SARS Coronavirus 2 by RT PCR: NEGATIVE

## 2022-07-13 LAB — BASIC METABOLIC PANEL
Anion gap: 6 (ref 5–15)
BUN: 25 mg/dL — ABNORMAL HIGH (ref 8–23)
CO2: 25 mmol/L (ref 22–32)
Calcium: 9.5 mg/dL (ref 8.9–10.3)
Chloride: 106 mmol/L (ref 98–111)
Creatinine, Ser: 1.33 mg/dL — ABNORMAL HIGH (ref 0.61–1.24)
GFR, Estimated: 55 mL/min — ABNORMAL LOW (ref >60)
Glucose, Bld: 149 mg/dL — ABNORMAL HIGH (ref 70–99)
Potassium: 4.8 mmol/L (ref 3.5–5.1)
Sodium: 137 mmol/L (ref 135–145)

## 2022-07-13 LAB — CBG MONITORING, ED: Glucose-Capillary: 149 mg/dL — ABNORMAL HIGH (ref 70–99)

## 2022-07-13 LAB — TROPONIN I (HIGH SENSITIVITY): Troponin I (High Sensitivity): 7 ng/L (ref <18)

## 2022-07-13 MED ORDER — BENZONATATE 100 MG PO CAPS: 100.0000 mg | ORAL_CAPSULE | Freq: Three times a day (TID) | ORAL | 0 refills | Status: DC

## 2022-07-13 MED ORDER — BENZONATATE 100 MG PO CAPS
100.0000 mg | ORAL_CAPSULE | Freq: Once | ORAL | Status: AC
  Administered 2022-07-13: 100 mg via ORAL
  Filled 2022-07-13: qty 1

## 2022-07-13 MED ORDER — AZITHROMYCIN 250 MG PO TABS: 250.0000 mg | ORAL_TABLET | Freq: Every day | ORAL | 0 refills | Status: DC

## 2022-07-13 MED ORDER — SALINE SPRAY 0.65 % NA SOLN
1.0000 | Freq: Once | NASAL | Status: AC
  Administered 2022-07-13: 1 via NASAL
  Filled 2022-07-13: qty 44

## 2022-07-13 NOTE — ED Triage Notes (Signed)
Pt went to UC this AM for URI symptoms: congestion, sore throat; pt advised to come to ED for further eval after O2 sat found to be 92-93% on RA, BP 105/57. Wife reports episode of dizziness last night.

## 2022-07-13 NOTE — ED Notes (Signed)
Patient seen before triage in lobby.  SpO2 97% HR 88  RR wnl, BBS with LLL rhonchi/exp wheeze.  No resp distress noted.

## 2022-07-13 NOTE — ED Provider Notes (Addendum)
[facility] EMERGENCY DEPARTMENT Provider Note   CSN: [id number] Arrival date & time: 07/13/22  1021     History  Chief Complaint  Patient presents with   URI    Derrick Edwards is a 76 y.o. male.   URI Presenting symptoms: cough and fever      76 year old male with medical history significant for CAD, DM 2, on metformin, GERD, nephrolithiasis, HLD who presents to the emergency department with URI symptoms.  The patient has had symptoms of cough, upper respiratory congestion, low-grade subjective fevers with associated nasal congestion at home since this past Thursday.  He felt worse yesterday.  He been taking OTC decongestants with a nighttime drowsy component and last night was ambulating when he felt lightheaded and fell to the ground.  He denies any head trauma or loss of consciousness.  He landed on his right hand sustaining an abrasion to the dorsum of the hand.  He denies any other injuries or complaints.  He denies any chest pain, shortness of breath, heart palpitations during the episode.  He thinks that he was overmedicated at the time.  CBG's have ranged in the 140s at home.  His tetanus is up-to-date.  He arrived to the emergency department GCS 15, ABC intact.  Home Medications Prior to Admission medications   Medication Sig Start Date End Date Taking? Authorizing Provider  azithromycin (ZITHROMAX) 250 MG tablet Take 1 tablet (250 mg total) by mouth daily. Take first 2 tablets together, then 1 every day until finished. 07/13/22  Yes Regan Lemming, MD  benzonatate (TESSALON) 100 MG capsule Take 1 capsule (100 mg total) by mouth every 8 (eight) hours. 07/13/22  Yes Regan Lemming, MD  aspirin EC 81 MG tablet Take 1 tablet (81 mg total) by mouth daily. 06/07/17   Jettie Booze, MD  atorvastatin (LIPITOR) 10 MG tablet Take 10 mg by mouth daily.    [provider]  cholecalciferol (VITAMIN D) 1000 UNITS tablet Take 1,000 Units by mouth daily.    [provider]  Coenzyme Q10 (COQ10) 100 MG CAPS Take 100 mg by mouth daily.    [provider]  empagliflozin (JARDIANCE) 25 MG TABS tablet Take 12.5 mg by mouth daily.    [provider]  lisinopril (PRINIVIL,ZESTRIL) 2.5 MG tablet Take 2.5 mg by mouth daily.    [provider]  metFORMIN (GLUCOPHAGE-XR) 500 MG 24 hr tablet Take 500 mg by mouth 3 (three) times daily.  Patient not taking: Reported on 10/13/2021    [provider]  nitroGLYCERIN (NITROSTAT) 0.4 MG SL tablet Place 0.4 mg under the tongue every 5 (five) minutes as needed for chest pain.    [provider]  Omega-3 Fatty Acids (FISH OIL) 1000 MG CAPS Take 1,000 mg by mouth daily.    [provider]  ONE TOUCH ULTRA TEST test strip 1 each by Other route as needed for other.  05/08/16   [provider]  rosuvastatin (CRESTOR) 5 MG tablet Take 5 mg by mouth daily at 6 PM.     [provider]  tamsulosin (FLOMAX) 0.4 MG CAPS capsule Take 0.4 mg by mouth daily after supper.    [provider]      Allergies    Ezetimibe-simvastatin    Review of Systems   Review of Systems  Constitutional:  Positive for fever.  Respiratory:  Positive for cough.   Neurological:  Positive for light-headedness.  All other systems reviewed and are  negative.   Physical Exam Updated Vital Signs BP (!) 140/60   Pulse 82   Temp 98.5 F (36.9 C)   Resp 18   SpO2 98%  Physical Exam Vitals and nursing note reviewed.  Constitutional:      Appearance: He is well-developed.     Comments: GCS 15, ABC intact  HENT:     Head: Normocephalic.  Eyes:     Conjunctiva/sclera: Conjunctivae normal.  Neck:     Comments: No midline tenderness to palpation of the cervical spine. ROM intact. Cardiovascular:     Rate and Rhythm: Normal rate and regular rhythm.  Pulmonary:     Effort: Pulmonary effort is normal. No tachypnea or respiratory distress.     Breath sounds: Normal breath  sounds. No wheezing, rhonchi or rales.  Chest:     Comments: Chest wall stable and non-tender to AP and lateral compression. Clavicles stable and non-tender to AP compression Abdominal:     Palpations: Abdomen is soft.     Tenderness: There is no abdominal tenderness.     Comments: Pelvis stable to lateral compression.  Musculoskeletal:     Cervical back: Neck supple.     Comments: No midline tenderness to palpation of the thoracic or lumbar spine. Extremities atraumatic with intact ROM with the exception of an abrasion noted to the dorsum of the right hand, hemostatic.  No anatomic snuffbox tenderness.  Right hand NVI.  Skin:    General: Skin is warm and dry.  Neurological:     Mental Status: He is alert.     Comments: CN II-XII grossly intact. Moving all four extremities spontaneously and sensation grossly intact.     ED Results / Procedures / Treatments   Labs (all labs ordered are listed, but only abnormal results are displayed) Labs Reviewed  CBC WITH DIFFERENTIAL/PLATELET - Abnormal; Notable for the following components:      Result Value   WBC 10.8 (*)    Monocytes Absolute 1.6 (*)    All other components within normal limits  BASIC METABOLIC PANEL - Abnormal; Notable for the following components:   Glucose, Bld 149 (*)    BUN 25 (*)    Creatinine, Ser 1.33 (*)    GFR, Estimated 55 (*)    All other components within normal limits  CBG MONITORING, ED - Abnormal; Notable for the following components:   Glucose-Capillary 149 (*)    All other components within normal limits  RESP PANEL BY RT-PCR (FLU A&B, COVID) ARPGX2  TROPONIN I (HIGH SENSITIVITY)    EKG EKG Interpretation  Date/Time:  Monday July 13 2022 15:40:31 EST Ventricular Rate:  83 PR Interval:  200 QRS Duration: 101 QT Interval:  346 QTC Calculation: 407 R Axis:   -38 Text Interpretation: Sinus rhythm Left axis deviation RSR' in V1 or V2, right VCD or RVH Baseline wander in lead(s) V4 Confirmed by  Regan Lemming ([id number]) on 07/13/2022 3:59:50 PM  Radiology DG Chest 2 View  Result Date: 07/13/2022 CLINICAL DATA:  Fever.  Cough. EXAM: CHEST - 2 VIEW COMPARISON:  CXR 03/26/14 FINDINGS: Status post median sternotomy and CABG. Unchanged cardiac and mediastinal contours. No pleural effusion. No pneumothorax. No focal airspace opacity. Visualized upper abdomen is unremarkable. No displaced rib fractures. Vertebral body heights are maintained. Sternotomy wires are intact. IMPRESSION: No active cardiopulmonary disease. Electronically Signed   By: Marin Roberts M.D.   On: 07/13/2022 11:01    Procedures Procedures    Medications Ordered in ED Medications  benzonatate (TESSALON) capsule 100 mg (100 mg Oral Given 07/13/22 1541)  sodium chloride (OCEAN) 0.65 % nasal spray 1 spray (1 spray Each Nare Given 07/13/22 1541)    ED Course/ Medical Decision Making/ A&P Clinical Course as of 07/13/22 1628  Mon Jul 13, 2022  1622 WBC(!): 10.8 [JL]  1622 Creatinine(!): 1.33 [JL]  1622 BUN(!): 25 [JL]    Clinical Course User Index [JL] Regan Lemming, MD                           Medical Decision Making Amount and/or Complexity of Data Reviewed Labs: ordered. Decision-making details documented in ED Course.  Risk OTC drugs. Prescription drug management.    76 year old male with medical history significant for CAD, DM 2, on metformin, GERD, nephrolithiasis, HLD who presents to the emergency department with URI symptoms.  The patient has had symptoms of cough, upper respiratory congestion, low-grade subjective fevers with associated nasal congestion at home since this past Thursday.  He felt worse yesterday.  He been taking OTC decongestants with a nighttime drowsy component and last night was ambulating when he felt lightheaded and fell to the ground.  He denies any head trauma or loss of consciousness.  He landed on his right hand sustaining an abrasion to the dorsum of the hand.  He denies any other  injuries or complaints.  He denies any chest pain, shortness of breath, heart palpitations during the episode.  He thinks that he was overmedicated at the time.  CBG's have ranged in the 140s at home.  His tetanus is up-to-date.  He arrived to the emergency department GCS 15, ABC intact.  On arrival, the patient was afebrile, not tachycardic or tachypneic, BP 123/64, saturating 97% on room air.  Neurologic exam normal on arrival.  Atraumatic with the exception of abrasion to the right hand on physical exam.  Tetanus is up-to-date.  Patient presenting with an episode of lightheadedness last night and subsequent fall to the ground.  No syncope, no head trauma or loss of consciousness.  Not on anticoagulation.  Denies any injuries or complaints beyond the abrasion to the right hand.  Has had URI symptoms for the last 4 days.    Ddx: Viral URI, COVID-19, influenza, bacterial pneumonia, left light abnormality.  Low concern for cardiogenic near syncope.  Has a normal neurologic exam.  EKG performed revealed sinus rhythm, no abnormal intervals.   Chest x-ray performed revealed no acute cardiac or pulm abnormality.  Patient's lungs were clear to auscultation bilaterally.  He states that his sputum has become more productive as of this morning.  He endorses weakness.  Laboratory evaluation significant for initial troponin negative, COVID-19 influenza PCR testing negative, CBC with a mild leukocytosis to 10.8, CBG 149, BMP with a BUN of 25, creatinine of 1.33.  Discussed admission for observation in the setting of a mild AKI versus continued oral rehydration and follow-up outpatient.  The patient would prefer to follow-up outpatient and is currently able to orally rehydrate.  Given the patient's worsening productive cough, leukocytosis, will prescribe a course of azithromycin, advised to orally rehydrate with electrolyte-containing solution, follow-up outpatient with his PCP for laboratory recheck.  Stable for  discharge.   Final Clinical Impression(s) / ED Diagnoses Final diagnoses:  AKI (acute kidney injury) ([place])  Dehydration  Upper respiratory tract infection, unspecified type  Fall, initial encounter  Lightheadedness    Rx / DC Orders ED Discharge Orders  Ordered    benzonatate (TESSALON) 100 MG capsule  Every 8 hours        07/13/22 1622    azithromycin (ZITHROMAX) 250 MG tablet  Daily        07/13/22 1627              Regan Lemming, MD 07/13/22 1627    Regan Lemming, MD 07/13/22 1627    Regan Lemming, MD 07/13/22 [phone]

## 2022-07-13 NOTE — Discharge Instructions (Addendum)
Your symptoms are consistent with likely viral upper respiratory infection.  Your chest x-ray showed no evidence of a bacterial pneumonia.  Your laboratory evaluation revealed evidence of a mild acute kidney injury.  Considered admission for observation and rehydration however, recommend you continue to aggressively orally rehydrate with electrolyte-containing solution like Gatorade, follow-up outpatient with your PCP in a couple of days for recheck of your kidney function.  The remainder of your labs were reassuring.  If your cough worsens, this could be developing bacterial pneumonia on top of your viral respiratory infection and an antibiotic prescription has been provided.

## 2022-07-13 NOTE — ED Notes (Signed)
Dc instructions and scripts reviewed with pt and spouse no questions or concerns at this time. Will follow up with pcp to have labs redrawn in 3 days.

## 2022-07-17 DIAGNOSIS — Z862 Personal history of diseases of the blood and blood-forming organs and certain disorders involving the immune mechanism: Secondary | ICD-10-CM | POA: Diagnosis not present

## 2022-07-17 DIAGNOSIS — N179 Acute kidney failure, unspecified: Secondary | ICD-10-CM | POA: Diagnosis not present

## 2022-07-17 DIAGNOSIS — E1165 Type 2 diabetes mellitus with hyperglycemia: Secondary | ICD-10-CM | POA: Diagnosis not present

## 2022-07-17 DIAGNOSIS — J069 Acute upper respiratory infection, unspecified: Secondary | ICD-10-CM | POA: Diagnosis not present

## 2022-08-18 DIAGNOSIS — E782 Mixed hyperlipidemia: Secondary | ICD-10-CM | POA: Diagnosis not present

## 2022-08-18 DIAGNOSIS — I700 Atherosclerosis of aorta: Secondary | ICD-10-CM | POA: Diagnosis not present

## 2022-08-18 DIAGNOSIS — M109 Gout, unspecified: Secondary | ICD-10-CM | POA: Diagnosis not present

## 2022-08-18 DIAGNOSIS — N401 Enlarged prostate with lower urinary tract symptoms: Secondary | ICD-10-CM | POA: Diagnosis not present

## 2022-08-18 DIAGNOSIS — K219 Gastro-esophageal reflux disease without esophagitis: Secondary | ICD-10-CM | POA: Diagnosis not present

## 2022-08-18 DIAGNOSIS — E1165 Type 2 diabetes mellitus with hyperglycemia: Secondary | ICD-10-CM | POA: Diagnosis not present

## 2022-08-18 DIAGNOSIS — I2510 Atherosclerotic heart disease of native coronary artery without angina pectoris: Secondary | ICD-10-CM | POA: Diagnosis not present

## 2022-08-18 DIAGNOSIS — E559 Vitamin D deficiency, unspecified: Secondary | ICD-10-CM | POA: Diagnosis not present

## 2022-08-28 DIAGNOSIS — Z0184 Encounter for antibody response examination: Secondary | ICD-10-CM | POA: Diagnosis not present

## 2022-11-08 NOTE — Progress Notes (Unsigned)
Cardiology Office Note   Date:  11/11/2022   ID:  Derrick Edwards, Derrick Edwards 1946/02/03, MRN [id number]  PCP:  Josetta Huddle, MD    No chief complaint on file.  CAD  Wt Readings from Last 3 Encounters:  11/11/22 150 lb 12.8 oz (68.4 kg)  10/13/21 159 lb (72.1 kg)  09/11/20 161 lb (73 kg)       History of Present Illness: Derrick Edwards is a 77 y.o. male  who had CABG, 3 vessel,  in 2012.   He had left knee replacement in 8/15.   In 2018, it was noted that: "Not walking much due to chronic foot pain.    In the past, he was eligible to use the [employer], but he has never gone.  He can have membership for free.  Wife stated "he won't go."   He still has issues with falling asleep easily when he sits in a recliner."   He has had some right knee pain.    Echo in 2022: "Left ventricular ejection fraction, by estimation, is 55 to 60%. The  left ventricle has normal function. The left ventricle has no regional  wall motion abnormalities. Left ventricular diastolic parameters are  indeterminate.   2. Right ventricular systolic function is mildly reduced. The right  ventricular size is normal. Tricuspid regurgitation signal is inadequate  for assessing PA pressure.   3. Left atrial size was mildly dilated.   4. The mitral valve is normal in structure. No evidence of mitral valve  regurgitation. No evidence of mitral stenosis.   5. The aortic valve is tricuspid. Aortic valve regurgitation is not  visualized. Mild aortic valve stenosis. Aortic valve mean gradient  measures 10.0 mmHg.   6. The inferior vena cava is normal in size with greater than 50%  respiratory variability, suggesting right atrial pressure of 3 mmHg. "  Has lost weight in the past year.    Had 1 fall in December 2023 when he had a respiratory illness.  Denies : Chest pain. Dizziness. Leg edema. Nitroglycerin use. Orthopnea. Palpitations. Paroxysmal nocturnal dyspnea. Shortness of breath. .    Not walking much,  works in the yard.  Does check BP at home.    Past Medical History:  Diagnosis Date   Arthralgia    with Vytorin and myalgias    Coronary atherosclerosis of unspecified type of vessel, native or graft    left main CAD, 10/2010-Dr. V and Dr. Cyndia Bent   Diabetes    GERD (gastroesophageal reflux disease)    History of kidney stones    History of tobacco use    Hyperplastic colon polyp    2, 2008, repeat colonoscopy in 2018   Low back pain    associated with degenerative disc disease    Mixed hyperlipidemia    Shoulder pain    Type II diabetes mellitus     Past Surgical History:  Procedure Laterality Date   BACK SURGERY     fusion for fracture verterbre    CARDIAC CATHETERIZATION  2012   CORONARY ARTERY BYPASS GRAFT  2012   KIDNEY STONE SURGERY  x 3   TOTAL KNEE ARTHROPLASTY Left 04/03/2014   DR DALLDORF   TOTAL KNEE ARTHROPLASTY Left 04/03/2014   Procedure: TOTAL KNEE ARTHROPLASTY;  Surgeon: Hessie Dibble, MD;  Location: [facility];  Service: Orthopedics;  Laterality: Left;     Current Outpatient Medications  Medication Sig Dispense Refill   Alogliptin Benzoate 25 MG TABS Take  1 tablet by mouth daily.     aspirin EC 81 MG tablet Take 1 tablet (81 mg total) by mouth daily. 90 tablet 3   atorvastatin (LIPITOR) 10 MG tablet Take 10 mg by mouth daily.     cholecalciferol (VITAMIN D) 1000 UNITS tablet Take 1,000 Units by mouth daily.     Coenzyme Q10 (COQ10) 100 MG CAPS Take 100 mg by mouth daily.     empagliflozin (JARDIANCE) 25 MG TABS tablet Take 12.5 mg by mouth daily.     lisinopril (PRINIVIL,ZESTRIL) 2.5 MG tablet Take 2.5 mg by mouth daily.     metFORMIN (GLUCOPHAGE-XR) 500 MG 24 hr tablet Take 500 mg by mouth 2 (two) times daily with a meal.     nitroGLYCERIN (NITROSTAT) 0.4 MG SL tablet Place 0.4 mg under the tongue every 5 (five) minutes as needed for chest pain.     Omega-3 Fatty Acids (FISH OIL) 1000 MG CAPS Take 1,000 mg by mouth daily.     ONE TOUCH ULTRA TEST test  strip 1 each by Other route as needed for other.      rosuvastatin (CRESTOR) 5 MG tablet Take 5 mg by mouth daily at 6 PM.      tamsulosin (FLOMAX) 0.4 MG CAPS capsule Take 0.4 mg by mouth daily after supper.     azithromycin (ZITHROMAX) 250 MG tablet Take 1 tablet (250 mg total) by mouth daily. Take first 2 tablets together, then 1 every day until finished. (Patient not taking: Reported on 11/11/2022) 6 tablet 0   benzonatate (TESSALON) 100 MG capsule Take 1 capsule (100 mg total) by mouth every 8 (eight) hours. (Patient not taking: Reported on 11/11/2022) 21 capsule 0   No current facility-administered medications for this visit.    Allergies:   Ezetimibe-simvastatin and Metformin    Social History:  The patient  reports that he quit smoking about 39 years ago. His smoking use included cigarettes. He has a 20.00 pack-year smoking history. He has never used smokeless tobacco. He reports that he does not drink alcohol and does not use drugs.   Family History:  The patient's family history includes Heart attack in his brother; Heart disease in his father; Stroke in his father.    ROS:  Please see the history of present illness.   Otherwise, review of systems are positive for weight loss.   All other systems are reviewed and negative.    PHYSICAL EXAM: VS:  BP (!) 96/48   Pulse 76   Ht 5\' 7"  (1.702 m)   Wt 150 lb 12.8 oz (68.4 kg)   SpO2 94%   BMI 23.62 kg/m  , BMI Body mass index is 23.62 kg/m. GEN: Well nourished, well developed, in no acute distress HEENT: normal Neck: no JVD, carotid bruits, or masses Cardiac: RRR; 2/6 early systolic murmur, no rubs, or gallops,no edema  Respiratory:  clear to auscultation bilaterally, normal work of breathing GI: soft, nontender, nondistended, + BS MS: no deformity or atrophy Skin: warm and dry, no rash Neuro:  Strength and sensation are intact Psych: euthymic mood, full affect   EKG:   The ekg ordered 12/23 demonstrates NSR, PACs, rSR', no  ST changes   Recent Labs: 07/13/2022: BUN 25; Creatinine, Ser 1.33; Hemoglobin 13.6; Platelets 159; Potassium 4.8; Sodium 137   Lipid Panel    Component Value Date/Time   CHOL  10/12/2010 0430    117        ATP III CLASSIFICATION:  <200  mg/dL   Desirable  200-239  mg/dL   Borderline High  >=240    mg/dL   High          TRIG 82 10/12/2010 0430   HDL 29 (L) 10/12/2010 0430   CHOLHDL 4.0 10/12/2010 0430   VLDL 16 10/12/2010 0430   LDLCALC  10/12/2010 0430    72        Total Cholesterol/HDL:CHD Risk Coronary Heart Disease Risk Table                     Men   Women  1/2 Average Risk   3.4   3.3  Average Risk       5.0   4.4  2 X Average Risk   9.6   7.1  3 X Average Risk  23.4   11.0        Use the calculated Patient Ratio above and the CHD Risk Table to determine the patient's CHD Risk.        ATP III CLASSIFICATION (LDL):  <100     mg/dL   Optimal  100-129  mg/dL   Near or Above                    Optimal  130-159  mg/dL   Borderline  160-189  mg/dL   High  >190     mg/dL   Very High     Other studies Reviewed: Additional studies/ records that were reviewed today with results demonstrating: labs, echo reviewed.   ASSESSMENT AND PLAN:  CAD: No angina.  Continue aggressive secondary prevention.  No bleeding on aspirin therapy. Hyperlipidemia: LDL 78 in Jan 2024.  Tolerating low-dose Crestor.  Had trouble with Zetia in the past.  Whole food, plant-based diet.  High-fiber diet.  Avoid processed foods. DM: Metformin was decreased for diarrhea.  A1C 8.0.  On low dose lisinopril.  Try to increase exercise to the target below incorporating 2 days of resistance training as well.  Maintain muscle mass will help keep his blood sugars low.  He is on low-dose lisinopril for kidney protection.  Blood pressures are at times low, particularly when he does not drink a lot of water.  We talked about staying hydrated.  If he has blood pressures consistently below [id number] systolic, or any  increased dizziness, would stop lisinopril.  He will let us know if symptoms change. Aortic stenosis: Mild in 2023.  No symptoms of passing out with the exception of when he had a respiratory illness. Daytime sleepiness: declined sleep study in the past.    Current medicines are reviewed at length with the patient today.  The patient concerns regarding his medicines were addressed.  The following changes have been made:  No change  Labs/ tests ordered today include: none No orders of the defined types were placed in this encounter.   Recommend 150 minutes/week of aerobic exercise Low fat, low carb, high fiber diet recommended  Disposition:   FU in 1 year   Signed, Larae Grooms, MD  11/11/2022 8:38 AM    [facility] [place], [place], [place]  [address] Phone: [phone]; Fax: [phone]

## 2022-11-11 ENCOUNTER — Encounter: Payer: Self-pay | Admitting: Interventional Cardiology

## 2022-11-11 ENCOUNTER — Ambulatory Visit: Payer: Medicare HMO | Attending: Interventional Cardiology | Admitting: Interventional Cardiology

## 2022-11-11 VITALS — BP 96/48 | HR 76 | Ht 67.0 in | Wt 150.8 lb

## 2022-11-11 DIAGNOSIS — I2510 Atherosclerotic heart disease of native coronary artery without angina pectoris: Secondary | ICD-10-CM | POA: Diagnosis not present

## 2022-11-11 DIAGNOSIS — E1159 Type 2 diabetes mellitus with other circulatory complications: Secondary | ICD-10-CM

## 2022-11-11 DIAGNOSIS — I252 Old myocardial infarction: Secondary | ICD-10-CM | POA: Diagnosis not present

## 2022-11-11 DIAGNOSIS — E782 Mixed hyperlipidemia: Secondary | ICD-10-CM | POA: Diagnosis not present

## 2022-11-11 DIAGNOSIS — I350 Nonrheumatic aortic (valve) stenosis: Secondary | ICD-10-CM

## 2022-11-11 NOTE — Patient Instructions (Signed)
Medication Instructions:  Your physician recommends that you continue on your current medications as directed. Please refer to the Current Medication list given to you today.  *If you need a refill on your cardiac medications before your next appointment, please call your pharmacy*   Lab Work: none If you have labs (blood work) drawn today and your tests are completely normal, you will receive your results only by: [place] (if you have MyChart) OR A paper copy in the mail If you have any lab test that is abnormal or we need to change your treatment, we will call you to review the results.   Testing/Procedures: none   Follow-Up: At [facility], you and your health needs are our priority.  As part of our continuing mission to provide you with exceptional heart care, we have created designated Provider Care Teams.  These Care Teams include your primary Cardiologist (physician) and Advanced Practice Providers (APPs -  Physician Assistants and Nurse Practitioners) who all work together to provide you with the care you need, when you need it.  We recommend signing up for the patient portal called "MyChart".  Sign up information is provided on this After Visit Summary.  MyChart is used to connect with patients for Virtual Visits (Telemedicine).  Patients are able to view lab/test results, encounter notes, upcoming appointments, etc.  Non-urgent messages can be sent to your provider as well.   To learn more about what you can do with MyChart, go to [email or web address].    Your next appointment:   12 month(s)  Provider:   Larae Grooms, MD     Other Instructions Check blood pressure at home.  Watch for dizziness and low blood pressure.  If systolic is consistently running 100 or less you can stop lisinopril.  Please let our office know if you stop lisinopril

## 2022-12-16 DIAGNOSIS — E1165 Type 2 diabetes mellitus with hyperglycemia: Secondary | ICD-10-CM | POA: Diagnosis not present

## 2023-02-22 DIAGNOSIS — I700 Atherosclerosis of aorta: Secondary | ICD-10-CM | POA: Diagnosis not present

## 2023-02-22 DIAGNOSIS — K219 Gastro-esophageal reflux disease without esophagitis: Secondary | ICD-10-CM | POA: Diagnosis not present

## 2023-02-22 DIAGNOSIS — E1165 Type 2 diabetes mellitus with hyperglycemia: Secondary | ICD-10-CM | POA: Diagnosis not present

## 2023-02-22 DIAGNOSIS — M109 Gout, unspecified: Secondary | ICD-10-CM | POA: Diagnosis not present

## 2023-02-22 DIAGNOSIS — Z0000 Encounter for general adult medical examination without abnormal findings: Secondary | ICD-10-CM | POA: Diagnosis not present

## 2023-02-22 DIAGNOSIS — N401 Enlarged prostate with lower urinary tract symptoms: Secondary | ICD-10-CM | POA: Diagnosis not present

## 2023-02-22 DIAGNOSIS — E782 Mixed hyperlipidemia: Secondary | ICD-10-CM | POA: Diagnosis not present

## 2023-02-22 DIAGNOSIS — I2510 Atherosclerotic heart disease of native coronary artery without angina pectoris: Secondary | ICD-10-CM | POA: Diagnosis not present

## 2023-02-22 DIAGNOSIS — E559 Vitamin D deficiency, unspecified: Secondary | ICD-10-CM | POA: Diagnosis not present

## 2023-02-22 DIAGNOSIS — Z23 Encounter for immunization: Secondary | ICD-10-CM | POA: Diagnosis not present

## 2023-05-06 ENCOUNTER — Encounter (HOSPITAL_BASED_OUTPATIENT_CLINIC_OR_DEPARTMENT_OTHER): Payer: Self-pay | Admitting: Emergency Medicine

## 2023-05-06 ENCOUNTER — Emergency Department (HOSPITAL_BASED_OUTPATIENT_CLINIC_OR_DEPARTMENT_OTHER)
Admission: EM | Admit: 2023-05-06 | Discharge: 2023-05-06 | Disposition: A | Discharge status: Home / Self Care | Payer: No Typology Code available for payment source | Attending: Emergency Medicine | Admitting: Emergency Medicine

## 2023-05-06 ENCOUNTER — Other Ambulatory Visit: Payer: Self-pay

## 2023-05-06 DIAGNOSIS — I951 Orthostatic hypotension: Secondary | ICD-10-CM | POA: Diagnosis not present

## 2023-05-06 DIAGNOSIS — Z79899 Other long term (current) drug therapy: Secondary | ICD-10-CM | POA: Diagnosis not present

## 2023-05-06 DIAGNOSIS — Z7982 Long term (current) use of aspirin: Secondary | ICD-10-CM | POA: Diagnosis not present

## 2023-05-06 DIAGNOSIS — N289 Disorder of kidney and ureter, unspecified: Secondary | ICD-10-CM | POA: Insufficient documentation

## 2023-05-06 DIAGNOSIS — I10 Essential (primary) hypertension: Secondary | ICD-10-CM | POA: Diagnosis not present

## 2023-05-06 DIAGNOSIS — R42 Dizziness and giddiness: Secondary | ICD-10-CM | POA: Diagnosis present

## 2023-05-06 LAB — BASIC METABOLIC PANEL
Anion gap: 10 (ref 5–15)
BUN: 19 mg/dL (ref 8–23)
CO2: 24 mmol/L (ref 22–32)
Calcium: 9.5 mg/dL (ref 8.9–10.3)
Chloride: 104 mmol/L (ref 98–111)
Creatinine, Ser: 1.31 mg/dL — ABNORMAL HIGH (ref 0.61–1.24)
GFR, Estimated: 56 mL/min — ABNORMAL LOW (ref >60)
Glucose, Bld: 138 mg/dL — ABNORMAL HIGH (ref 70–99)
Potassium: 4.6 mmol/L (ref 3.5–5.1)
Sodium: 138 mmol/L (ref 135–145)

## 2023-05-06 LAB — CBC
HCT: 41.3 % (ref 39.0–52.0)
Hemoglobin: 13.5 g/dL (ref 13.0–17.0)
MCH: 30.4 pg (ref 26.0–34.0)
MCHC: 32.7 g/dL (ref 30.0–36.0)
MCV: 93 fL (ref 80.0–100.0)
Platelets: 179 10*3/uL (ref 150–400)
RBC: 4.44 MIL/uL (ref 4.22–5.81)
RDW: 12.7 % (ref 11.5–15.5)
WBC: 6.5 10*3/uL (ref 4.0–10.5)
nRBC: 0 % (ref 0.0–0.2)

## 2023-05-06 LAB — CBG MONITORING, ED: Glucose-Capillary: 117 mg/dL — ABNORMAL HIGH (ref 70–99)

## 2023-05-06 MED ORDER — LACTATED RINGERS IV BOLUS
1000.0000 mL | Freq: Once | INTRAVENOUS | Status: AC
  Administered 2023-05-06: 1000 mL via INTRAVENOUS

## 2023-05-06 NOTE — ED Notes (Signed)
Fall risk armband Fall sign on door Patient wearing shoes

## 2023-05-06 NOTE — Discharge Instructions (Signed)
Your symptoms improved after fluid bolus.  Drink plenty of water.  Recommend you drink less coffee as that will cause you to be more fluid out and become dehydrated.  If your blood pressure is too low such as less than 100 for the top number, or less than 45 on the bottom number do not take your blood pressure medication that day.  Follow-up with your primary care provider sleep lab pressure medication needs to be adjusted.  Concerning symptoms return to the emergency room.

## 2023-05-06 NOTE — ED Triage Notes (Signed)
Dizziness and low BP x 4 days . Hx HTN . Takes Lisinopril. No headache . Ambulatory to room with steady gait . Alert and orienetd x 4

## 2023-05-06 NOTE — ED Provider Notes (Signed)
[facility] Provider Note   CSN: [id number] Arrival date & time: 05/06/23  1017     History  Chief Complaint  Patient presents with   Dizziness    Derrick Edwards is a 77 y.o. male.  77 year old male with past medical history significant for hypertension presents today for concern of lightheadedness with position change that has been ongoing now for about 4 to 5 days.  He reports last with his antihypertensive.  He does endorse decreased water intake but reports he drinks about 4 to 5 cups of coffee per day mostly all in the morning Diet Coke.  He denies chest pain, shortness of breath, or other complaints.  Has not had a syncopal episode.  The history is provided by the patient. No language interpreter was used.       Home Medications Prior to Admission medications   Medication Sig Start Date End Date Taking? Authorizing Provider  Alogliptin Benzoate 25 MG TABS Take 1 tablet by mouth daily. 05/15/22   [provider]  aspirin EC 81 MG tablet Take 1 tablet (81 mg total) by mouth daily. 06/07/17   Corky Crafts, MD  atorvastatin (LIPITOR) 10 MG tablet Take 10 mg by mouth daily.    [provider]  azithromycin (ZITHROMAX) 250 MG tablet Take 1 tablet (250 mg total) by mouth daily. Take first 2 tablets together, then 1 every day until finished. Patient not taking: Reported on 11/11/2022 07/13/22   Ernie Avena, MD  benzonatate (TESSALON) 100 MG capsule Take 1 capsule (100 mg total) by mouth every 8 (eight) hours. Patient not taking: Reported on 11/11/2022 07/13/22   Ernie Avena, MD  cholecalciferol (VITAMIN D) 1000 UNITS tablet Take 1,000 Units by mouth daily.    [provider]  Coenzyme Q10 (COQ10) 100 MG CAPS Take 100 mg by mouth daily.    [provider]  empagliflozin (JARDIANCE) 25 MG TABS tablet Take 12.5 mg by mouth daily.    [provider]  lisinopril (PRINIVIL,ZESTRIL) 2.5 MG  tablet Take 2.5 mg by mouth daily.    [provider]  metFORMIN (GLUCOPHAGE-XR) 500 MG 24 hr tablet Take 500 mg by mouth 2 (two) times daily with a meal.    [provider]  nitroGLYCERIN (NITROSTAT) 0.4 MG SL tablet Place 0.4 mg under the tongue every 5 (five) minutes as needed for chest pain.    [provider]  Omega-3 Fatty Acids (FISH OIL) 1000 MG CAPS Take 1,000 mg by mouth daily.    [provider]  ONE TOUCH ULTRA TEST test strip 1 each by Other route as needed for other.  05/08/16   [provider]  rosuvastatin (CRESTOR) 5 MG tablet Take 5 mg by mouth daily at 6 PM.     [provider]  tamsulosin (FLOMAX) 0.4 MG CAPS capsule Take 0.4 mg by mouth daily after supper.    [provider]      Allergies    Ezetimibe-simvastatin and Metformin    Review of Systems   Review of Systems  Constitutional:  Negative for chills and fever.  Respiratory:  Negative for shortness of breath.   Cardiovascular:  Negative for chest pain.  Neurological:  Positive for light-headedness. Negative for syncope.  All other systems reviewed and are negative.   Physical Exam Updated Vital Signs BP 134/77   Pulse 80   Temp 97.8 F (36.6 C)   Resp 18   Wt 70.8  kg   SpO2 96%   BMI 24.43 kg/m  Physical Exam Vitals and nursing note reviewed.  Constitutional:      General: He is not in acute distress.    Appearance: Normal appearance. He is not ill-appearing.  HENT:     Head: Normocephalic and atraumatic.     Nose: Nose normal.  Eyes:     Conjunctiva/sclera: Conjunctivae normal.  Cardiovascular:     Rate and Rhythm: Normal rate and regular rhythm.     Heart sounds: Normal heart sounds.  Pulmonary:     Effort: Pulmonary effort is normal. No respiratory distress.     Breath sounds: Normal breath sounds. No wheezing.  Abdominal:     Palpations: Abdomen is soft.  Musculoskeletal:        General: No deformity. Normal range of motion.      Cervical back: Normal range of motion.  Skin:    Findings: No rash.  Neurological:     Mental Status: He is alert.     ED Results / Procedures / Treatments   Labs (all labs ordered are listed, but only abnormal results are displayed) Labs Reviewed  BASIC METABOLIC PANEL - Abnormal; Notable for the following components:      Result Value   Glucose, Bld 138 (*)    Creatinine, Ser 1.31 (*)    GFR, Estimated 56 (*)    All other components within normal limits  CBG MONITORING, ED - Abnormal; Notable for the following components:   Glucose-Capillary 117 (*)    All other components within normal limits  CBC  URINALYSIS, ROUTINE W REFLEX MICROSCOPIC    EKG None  Radiology No results found.  Procedures Procedures    Medications Ordered in ED Medications  lactated ringers bolus 1,000 mL (1,000 mLs Intravenous New Bag/Given 05/06/23 1109)    ED Course/ Medical Decision Making/ A&P                                 Medical Decision Making Amount and/or Complexity of Data Reviewed Labs: ordered.   77 year old male presents today for concern of lightheadedness.  He has lightheadedness mostly only with position change.  He drinks significant caffeine through about 4 to 5 cups of coffee in the morning as well as Diet Coke throughout the day.  Minimal water intake.  Orthostatic positive.  Will give fluids, obtain basic blood work and reevaluate.  CBC unremarkable.  BMP shows mild renal insufficiency with creatinine of 1.21 otherwise without acute concern.  After fluid bolus patient reports improvement in symptoms.  Likely due to dehydration from significant caffeine intake.  Discussed reducing caffeine consumption and increasing water intake.  Discussed close follow-up with PCP.  Discussed if his systolic blood pressure is less than 100 to hold his lisinopril dose until he follows up with PCP.  He is appropriate for discharge.  Discharged in stable condition.  Return precautions  discussed.   Final Clinical Impression(s) / ED Diagnoses Final diagnoses:  Orthostasis    Rx / DC Orders ED Discharge Orders     None         Marita Kansas, PA-C 05/06/23 1340    Tanda Rockers A, DO 05/12/23 [phone]

## 2023-08-25 DIAGNOSIS — E1169 Type 2 diabetes mellitus with other specified complication: Secondary | ICD-10-CM | POA: Diagnosis not present

## 2023-08-25 DIAGNOSIS — I10 Essential (primary) hypertension: Secondary | ICD-10-CM | POA: Diagnosis not present

## 2023-08-25 DIAGNOSIS — G20B1 Parkinson's disease with dyskinesia, without mention of fluctuations: Secondary | ICD-10-CM | POA: Diagnosis not present

## 2023-11-19 ENCOUNTER — Encounter: Payer: Self-pay | Admitting: Cardiology

## 2023-11-19 ENCOUNTER — Ambulatory Visit: Payer: No Typology Code available for payment source | Attending: Cardiology | Admitting: Cardiology

## 2023-11-19 VITALS — BP 98/58 | HR 74 | Resp 16 | Ht 67.0 in | Wt 152.4 lb

## 2023-11-19 DIAGNOSIS — E1122 Type 2 diabetes mellitus with diabetic chronic kidney disease: Secondary | ICD-10-CM

## 2023-11-19 DIAGNOSIS — N1831 Chronic kidney disease, stage 3a: Secondary | ICD-10-CM

## 2023-11-19 DIAGNOSIS — I2510 Atherosclerotic heart disease of native coronary artery without angina pectoris: Secondary | ICD-10-CM | POA: Diagnosis not present

## 2023-11-19 DIAGNOSIS — I350 Nonrheumatic aortic (valve) stenosis: Secondary | ICD-10-CM | POA: Diagnosis not present

## 2023-11-19 DIAGNOSIS — E782 Mixed hyperlipidemia: Secondary | ICD-10-CM | POA: Diagnosis not present

## 2023-11-19 NOTE — Patient Instructions (Signed)
 Medication Instructions:  Your physician recommends that you continue on your current medications as directed. Please refer to the Current Medication list given to you today.  *If you need a refill on your cardiac medications before your next appointment, please call your pharmacy*  Lab Work: none If you have labs (blood work) drawn today and your tests are completely normal, you will receive your results only by: MyChart Message (if you have MyChart) OR A paper copy in the mail If you have any lab test that is abnormal or we need to change your treatment, we will call you to review the results.  Testing/Procedures: none  Follow-Up: At [facility], you and your health needs are our priority.  As part of our continuing mission to provide you with exceptional heart care, our providers are all part of one team.  This team includes your primary Cardiologist (physician) and Advanced Practice Providers or APPs (Physician Assistants and Nurse Practitioners) who all work together to provide you with the care you need, when you need it.  Your next appointment:   12 month(s)  Provider:   Yates Decamp, MD     We recommend signing up for the patient portal called "MyChart".  Sign up information is provided on this After Visit Summary.  MyChart is used to connect with patients for Virtual Visits (Telemedicine).  Patients are able to view lab/test results, encounter notes, upcoming appointments, etc.  Non-urgent messages can be sent to your provider as well.   To learn more about what you can do with MyChart, go to [email or web address].   Other Instructions        1st Floor: - Lobby - Registration  - Pharmacy  - Lab - Cafe  2nd Floor: - PV Lab - Diagnostic Testing (echo, CT, nuclear med)  3rd Floor: - Vacant  4th Floor: - TCTS (cardiothoracic surgery) - AFib Clinic - Structural Heart Clinic - Vascular Surgery  - Vascular Ultrasound  5th Floor: - [facility]  (general and EP) - Clinical Pharmacy for coumadin, hypertension, lipid, weight-loss medications, and med management appointments    Valet parking services will be available as well.

## 2023-11-19 NOTE — Progress Notes (Signed)
 Cardiology Office Note:  .   Date:  11/19/2023  ID:  Derrick Edwards, DOB 1946-05-22, MRN [id number] PCP: Tena Feeling, MD  [facility] Providers Cardiologist:  Knox Perl, MD   History of Present Illness: .   Derrick Edwards is a 78 y.o. Caucasian male patient with CABG x 3 in 2012 for left main disease, last echocardiogram in 2022 revealing normal LVEF, mild aortic valve stenosis, longstanding diabetes mellitus with stage IIIa chronic kidney disease, mixed hypercholesterolemia and degenerative joint disease.  Latest evaluation in the emergency room was on 05/06/2023 presenting with dizziness and found to have severe orthostatic hypotension. The patient is generally active around the house and does not report any recent episodes of dizziness.  Presently doing well and remains asymptomatic.  Accompanied by his wife.  Discussed the use of AI scribe software for clinical note transcription with the patient, who gave verbal consent to proceed.  Labs    Lab Results  Component Value Date   NA 138 05/06/2023   K 4.6 05/06/2023   CO2 24 05/06/2023   GLUCOSE 138 (H) 05/06/2023   BUN 19 05/06/2023   CREATININE 1.31 (H) 05/06/2023   CALCIUM 9.5 05/06/2023   GFRNONAA 56 (L) 05/06/2023      Latest Ref Rng & Units 05/06/2023   10:29 AM 07/13/2022    3:13 PM 04/14/2014    6:00 PM  BMP  Glucose 70 - 99 mg/dL 034  742  595   BUN 8 - 23 mg/dL 19  25  24    Creatinine 0.61 - 1.24 mg/dL 6.38  7.56  4.33   Sodium 135 - 145 mmol/L 138  137  138   Potassium 3.5 - 5.1 mmol/L 4.6  4.8  5.1   Chloride 98 - 111 mmol/L 104  106  100   CO2 22 - 32 mmol/L 24  25  25    Calcium 8.9 - 10.3 mg/dL 9.5  9.5  29.5       Latest Ref Rng & Units 05/06/2023   10:29 AM 07/13/2022    3:13 PM 04/14/2014    6:00 PM  CBC  WBC 4.0 - 10.5 K/uL 6.5  10.8  8.5   Hemoglobin 13.0 - 17.0 g/dL 18.8  41.6  60.6   Hematocrit 39.0 - 52.0 % 41.3  41.0  38.5   Platelets 150 - 400 K/uL 179  159  321    External Labs:  K PN  PCP labs 08/18/2022:  Total cholesterol 137, triglycerides 104, HDL 39, LDL 78.  Labs 12/16/2022:  A1c 7.2%.  TSH normal at 1.520.  Review of Systems  Cardiovascular:  Negative for chest pain, dyspnea on exertion and leg swelling.   Physical Exam:   VS:  BP (!) 98/58 (BP Location: Left Arm, Patient Position: Sitting, Cuff Size: Normal)   Pulse 74   Resp 16   Ht 5\' 7"  (1.702 m)   Wt 152 lb 6.4 oz (69.1 kg)   SpO2 94%   BMI 23.87 kg/m    Wt Readings from Last 3 Encounters:  11/19/23 152 lb 6.4 oz (69.1 kg)  05/06/23 156 lb (70.8 kg)  11/11/22 150 lb 12.8 oz (68.4 kg)    Orthostatic VS for the past 72 hrs (Last 3 readings):  Patient Position BP Location Cuff Size  11/19/23 0925 Sitting Left Arm Normal   Physical Exam Neck:     Vascular: No JVD.  Cardiovascular:     Rate and Rhythm: Normal rate  and regular rhythm.     Pulses: Intact distal pulses.          Dorsalis pedis pulses are 1+ on the right side and 1+ on the left side.       Posterior tibial pulses are 1+ on the right side and 1+ on the left side.     Heart sounds: S1 normal and S2 normal. Murmur heard.     Midsystolic murmur is present with a grade of 3/6 at the upper right sternal border radiating to the neck.     No gallop.  Pulmonary:     Effort: Pulmonary effort is normal.     Breath sounds: Normal breath sounds.  Abdominal:     General: Bowel sounds are normal.     Palpations: Abdomen is soft.  Musculoskeletal:     Right lower leg: No edema.     Left lower leg: No edema.    Studies Reviewed: Aaron Aas    ECHOCARDIOGRAM COMPLETE 10/27/2021  1. Left ventricular ejection fraction, by estimation, is 55 to 60%. The left ventricle has normal function. The left ventricle has no regional wall motion abnormalities. Left ventricular diastolic parameters are indeterminate. 2. Right ventricular systolic function is mildly reduced. The right ventricular size is normal. Tricuspid regurgitation signal is inadequate for assessing  PA pressure. 3. The mitral valve is normal in structure. Trivial mitral valve regurgitation. No evidence of mitral stenosis. 4. The aortic valve is tricuspid. There is mild calcification of the aortic valve. There is mild thickening of the aortic valve. Aortic valve regurgitation is not visualized. Mild aortic valve stenosis. Aortic valve area, by VTI measures 1.12 cm. Aortic valve mean gradient measures 10.0 mmHg. Aortic valve Vmax measures 2.10 m/s. 5. The inferior vena cava is normal in size with greater than 50% respiratory variability, suggesting right atrial pressure of 3 mmHg.  EKG:    EKG Interpretation Date/Time:  Friday November 19 2023 09:24:52 EDT Ventricular Rate:  76 PR Interval:  242 QRS Duration:  90 QT Interval:  354 QTC Calculation: 398 R Axis:   -23  Text Interpretation: EKG 11/19/2023: Sinus rhythm with first-degree AV block at rate of 76 bpm, leftward axis, incomplete right bundle branch block.  Early R wave progression, cannot completely exclude true posterior infarct old.  Compared to 05/06/2023, sinus arrest (check) versus nonconducted PACs no longer present. Confirmed by Omega Slager, Jagadeesh ([id number]) on 11/19/2023 9:58:27 AM    Medications and allergies    Allergies  Allergen Reactions   Ezetimibe-Simvastatin Other (See Comments)    myalgias   Metformin Diarrhea    Current Outpatient Medications:    Alogliptin Benzoate 25 MG TABS, Take 1 tablet by mouth daily., Disp: , Rfl:    aspirin EC 81 MG tablet, Take 1 tablet (81 mg total) by mouth daily., Disp: 90 tablet, Rfl: 3   atorvastatin (LIPITOR) 10 MG tablet, Take 10 mg by mouth daily., Disp: , Rfl:    carbidopa-levodopa (SINEMET IR) 25-100 MG tablet, Take 1.5 tablets by mouth 3 (three) times daily., Disp: , Rfl:    cholecalciferol (VITAMIN D) 1000 UNITS tablet, Take 1,000 Units by mouth daily., Disp: , Rfl:    Coenzyme Q10 (COQ10) 100 MG CAPS, Take 100 mg by mouth daily., Disp: , Rfl:    empagliflozin (JARDIANCE) 25  MG TABS tablet, Take 12.5 mg by mouth daily., Disp: , Rfl:    lisinopril (PRINIVIL,ZESTRIL) 2.5 MG tablet, Take 2.5 mg by mouth at bedtime., Disp: , Rfl:    metFORMIN (  GLUCOPHAGE-XR) 500 MG 24 hr tablet, Take 500 mg by mouth 2 (two) times daily with a meal., Disp: , Rfl:    nitroGLYCERIN (NITROSTAT) 0.4 MG SL tablet, Place 0.4 mg under the tongue every 5 (five) minutes as needed for chest pain., Disp: , Rfl:    Omega-3 Fatty Acids (FISH OIL) 1000 MG CAPS, Take 1,000 mg by mouth daily., Disp: , Rfl:    ONE TOUCH ULTRA TEST test strip, 1 each by Other route as needed for other. , Disp: , Rfl:    tamsulosin (FLOMAX) 0.4 MG CAPS capsule, Take 0.4 mg by mouth daily after supper., Disp: , Rfl:    No orders of the defined types were placed in this encounter.    Medications Discontinued During This Encounter  Medication Reason   rosuvastatin (CRESTOR) 5 MG tablet Change in therapy   azithromycin (ZITHROMAX) 250 MG tablet Completed Course   benzonatate (TESSALON) 100 MG capsule Completed Course     ASSESSMENT AND PLAN: .      ICD-10-CM   1. Atherosclerosis of native coronary artery of native heart without angina pectoris  I25.10 EKG 12-Lead    2. Mixed hyperlipidemia  E78.2     3. Nonrheumatic aortic valve stenosis  I35.0     4. Type 2 diabetes mellitus with stage 3a chronic kidney disease, without long-term current use of insulin (HCC)  E11.22    N18.31       1. Atherosclerosis of native coronary artery of native heart without angina pectoris He has coronary artery disease with CABG performed in 2012. Echocardiograms from 2022 and 2023 indicate normal heart function with very mild aortic stenosis. EKG shows sinus rhythm with first-degree AV block, leftward axis, and incomplete right bundle branch block, unchanged from 2024. No acute cardiac issues are present. Continue current cardiac management and schedule a follow-up in one year.   2. Mixed hyperlipidemia LDL cholesterol is not at the  desired level based on the January 2024 panel.  Prefer to have his LDL goal to <70, presently 78.  He is on atorvastatin 10 mg. Consider increasing atorvastatin to 20 mg or adding ezetimibe (Zarah) for better LDL control. Send a note to Dr. Candi Chafe to consider these options.  Patient has an appointment to see her soon along with repeat labs.  3. Nonrheumatic aortic valve stenosis Very mild aortic cyst, does not need any repeat echocardiogram at the present time.  4.  Type 2 diabetes mellitus with stage IIIa chronic kidney disease Lisinopril is used for kidney protection due to diabetes. The current dosage of 2.5 mg is appropriate especially in view of orthostatic hypotension.  He has no history of hypertension.  Continue lisinopril 2.5 mg at night for kidney protection.  He is also on Jardiance, continue the same.  I will see him back in a year or sooner if problems.   Signed,  Knox Perl, MD, [facility] 11/19/2023, 10:09 AM [facility] [address] [place], [place] [address] Phone: [phone]. Fax:  [phone]

## 2024-01-07 DIAGNOSIS — R051 Acute cough: Secondary | ICD-10-CM | POA: Diagnosis not present

## 2024-01-07 DIAGNOSIS — R0981 Nasal congestion: Secondary | ICD-10-CM | POA: Diagnosis not present

## 2024-02-07 DIAGNOSIS — N401 Enlarged prostate with lower urinary tract symptoms: Secondary | ICD-10-CM | POA: Diagnosis not present

## 2024-02-07 DIAGNOSIS — G20B1 Parkinson's disease with dyskinesia, without mention of fluctuations: Secondary | ICD-10-CM | POA: Diagnosis not present

## 2024-02-07 DIAGNOSIS — E1165 Type 2 diabetes mellitus with hyperglycemia: Secondary | ICD-10-CM | POA: Diagnosis not present

## 2024-02-07 DIAGNOSIS — I10 Essential (primary) hypertension: Secondary | ICD-10-CM | POA: Diagnosis not present

## 2024-02-25 DIAGNOSIS — R031 Nonspecific low blood-pressure reading: Secondary | ICD-10-CM | POA: Diagnosis not present

## 2024-02-25 DIAGNOSIS — G20B1 Parkinson's disease with dyskinesia, without mention of fluctuations: Secondary | ICD-10-CM | POA: Diagnosis not present

## 2024-02-25 DIAGNOSIS — I2510 Atherosclerotic heart disease of native coronary artery without angina pectoris: Secondary | ICD-10-CM | POA: Diagnosis not present

## 2024-02-25 DIAGNOSIS — Z136 Encounter for screening for cardiovascular disorders: Secondary | ICD-10-CM | POA: Diagnosis not present

## 2024-02-25 DIAGNOSIS — Z1331 Encounter for screening for depression: Secondary | ICD-10-CM | POA: Diagnosis not present

## 2024-02-25 DIAGNOSIS — I700 Atherosclerosis of aorta: Secondary | ICD-10-CM | POA: Diagnosis not present

## 2024-02-25 DIAGNOSIS — E782 Mixed hyperlipidemia: Secondary | ICD-10-CM | POA: Diagnosis not present

## 2024-02-25 DIAGNOSIS — N401 Enlarged prostate with lower urinary tract symptoms: Secondary | ICD-10-CM | POA: Diagnosis not present

## 2024-02-25 DIAGNOSIS — E1165 Type 2 diabetes mellitus with hyperglycemia: Secondary | ICD-10-CM | POA: Diagnosis not present

## 2024-02-25 DIAGNOSIS — Z0000 Encounter for general adult medical examination without abnormal findings: Secondary | ICD-10-CM | POA: Diagnosis not present

## 2024-02-25 DIAGNOSIS — E559 Vitamin D deficiency, unspecified: Secondary | ICD-10-CM | POA: Diagnosis not present

## 2024-03-09 DIAGNOSIS — E1165 Type 2 diabetes mellitus with hyperglycemia: Secondary | ICD-10-CM | POA: Diagnosis not present

## 2024-03-09 DIAGNOSIS — N401 Enlarged prostate with lower urinary tract symptoms: Secondary | ICD-10-CM | POA: Diagnosis not present

## 2024-03-09 DIAGNOSIS — I10 Essential (primary) hypertension: Secondary | ICD-10-CM | POA: Diagnosis not present

## 2024-03-09 DIAGNOSIS — G20B1 Parkinson's disease with dyskinesia, without mention of fluctuations: Secondary | ICD-10-CM | POA: Diagnosis not present

## 2024-03-24 DIAGNOSIS — Z87891 Personal history of nicotine dependence: Secondary | ICD-10-CM | POA: Diagnosis not present

## 2024-03-24 DIAGNOSIS — I700 Atherosclerosis of aorta: Secondary | ICD-10-CM | POA: Diagnosis not present

## 2024-04-09 DIAGNOSIS — G20B1 Parkinson's disease with dyskinesia, without mention of fluctuations: Secondary | ICD-10-CM | POA: Diagnosis not present

## 2024-04-09 DIAGNOSIS — N401 Enlarged prostate with lower urinary tract symptoms: Secondary | ICD-10-CM | POA: Diagnosis not present

## 2024-04-09 DIAGNOSIS — I10 Essential (primary) hypertension: Secondary | ICD-10-CM | POA: Diagnosis not present

## 2024-04-09 DIAGNOSIS — E1165 Type 2 diabetes mellitus with hyperglycemia: Secondary | ICD-10-CM | POA: Diagnosis not present

## 2024-05-09 DIAGNOSIS — G20B1 Parkinson's disease with dyskinesia, without mention of fluctuations: Secondary | ICD-10-CM | POA: Diagnosis not present

## 2024-05-09 DIAGNOSIS — N401 Enlarged prostate with lower urinary tract symptoms: Secondary | ICD-10-CM | POA: Diagnosis not present

## 2024-05-09 DIAGNOSIS — I10 Essential (primary) hypertension: Secondary | ICD-10-CM | POA: Diagnosis not present

## 2024-05-09 DIAGNOSIS — E1165 Type 2 diabetes mellitus with hyperglycemia: Secondary | ICD-10-CM | POA: Diagnosis not present

## 2024-06-09 DIAGNOSIS — N401 Enlarged prostate with lower urinary tract symptoms: Secondary | ICD-10-CM | POA: Diagnosis not present

## 2024-06-09 DIAGNOSIS — I10 Essential (primary) hypertension: Secondary | ICD-10-CM | POA: Diagnosis not present

## 2024-06-09 DIAGNOSIS — E1165 Type 2 diabetes mellitus with hyperglycemia: Secondary | ICD-10-CM | POA: Diagnosis not present

## 2024-06-09 DIAGNOSIS — G20B1 Parkinson's disease with dyskinesia, without mention of fluctuations: Secondary | ICD-10-CM | POA: Diagnosis not present

## 2024-07-09 DIAGNOSIS — N401 Enlarged prostate with lower urinary tract symptoms: Secondary | ICD-10-CM | POA: Diagnosis not present

## 2024-07-09 DIAGNOSIS — I10 Essential (primary) hypertension: Secondary | ICD-10-CM | POA: Diagnosis not present

## 2024-07-09 DIAGNOSIS — G20B1 Parkinson's disease with dyskinesia, without mention of fluctuations: Secondary | ICD-10-CM | POA: Diagnosis not present

## 2024-07-09 DIAGNOSIS — E1165 Type 2 diabetes mellitus with hyperglycemia: Secondary | ICD-10-CM | POA: Diagnosis not present

## 2024-07-28 NOTE — Progress Notes (Addendum)
 Derrick Edwards                                          MRN: [id number]   08/24/2024   The [facility] Quality Team Specialist reviewed this patient medical record for the purposes of chart review for care gap closure. The following were reviewed: chart review for care gap closure-kidney health evaluation for diabetes:eGFR  and uACR.    [facility] Quality Team
